# Patient Record
Sex: Male | Born: 1935 | Race: White | Hispanic: No | Marital: Married | Smoking: Never smoker
Health system: Southern US, Community
[De-identification: ages and names within clinical notes are randomized; demographics above are authoritative.]

## PROBLEM LIST (undated history)

## (undated) DIAGNOSIS — Z8719 Personal history of other diseases of the digestive system: Secondary | ICD-10-CM

## (undated) DIAGNOSIS — D72820 Lymphocytosis (symptomatic): Secondary | ICD-10-CM

## (undated) DIAGNOSIS — Z8673 Personal history of transient ischemic attack (TIA), and cerebral infarction without residual deficits: Secondary | ICD-10-CM

## (undated) DIAGNOSIS — I2699 Other pulmonary embolism without acute cor pulmonale: Secondary | ICD-10-CM

## (undated) DIAGNOSIS — E785 Hyperlipidemia, unspecified: Secondary | ICD-10-CM

## (undated) DIAGNOSIS — O2230 Deep phlebothrombosis in pregnancy, unspecified trimester: Secondary | ICD-10-CM

## (undated) DIAGNOSIS — C9190 Lymphoid leukemia, unspecified not having achieved remission: Secondary | ICD-10-CM

## (undated) DIAGNOSIS — R42 Dizziness and giddiness: Secondary | ICD-10-CM

## (undated) DIAGNOSIS — I10 Essential (primary) hypertension: Secondary | ICD-10-CM

## (undated) HISTORY — DX: Essential (primary) hypertension: I10

## (undated) HISTORY — DX: Personal history of transient ischemic attack (TIA), and cerebral infarction without residual deficits: Z86.73

## (undated) HISTORY — DX: Deep phlebothrombosis in pregnancy, unspecified trimester: O22.30

## (undated) HISTORY — DX: Lymphoid leukemia, unspecified not having achieved remission: C91.90

## (undated) HISTORY — DX: Other pulmonary embolism without acute cor pulmonale: I26.99

## (undated) HISTORY — PX: OTHER SURGICAL HISTORY: SHX169

## (undated) HISTORY — DX: Lymphocytosis (symptomatic): D72.820

## (undated) HISTORY — DX: Personal history of other diseases of the digestive system: Z87.19

## (undated) HISTORY — DX: Hyperlipidemia, unspecified: E78.5

## (undated) HISTORY — DX: Dizziness and giddiness: R42

---

## 2001-01-18 HISTORY — PX: OTHER SURGICAL HISTORY: SHX169

## 2001-01-31 ENCOUNTER — Encounter: Payer: Self-pay | Admitting: Emergency Medicine

## 2001-01-31 ENCOUNTER — Inpatient Hospital Stay (HOSPITAL_COMMUNITY): Admission: EM | Admit: 2001-01-31 | Discharge: 2001-02-01 | Payer: Self-pay | Admitting: Emergency Medicine

## 2001-02-01 ENCOUNTER — Encounter: Payer: Self-pay | Admitting: Family Medicine

## 2001-02-06 ENCOUNTER — Encounter: Admission: RE | Admit: 2001-02-06 | Discharge: 2001-02-06 | Payer: Self-pay | Admitting: Family Medicine

## 2001-02-13 ENCOUNTER — Encounter: Admission: RE | Admit: 2001-02-13 | Discharge: 2001-02-13 | Payer: Self-pay | Admitting: Family Medicine

## 2001-02-22 ENCOUNTER — Encounter: Admission: RE | Admit: 2001-02-22 | Discharge: 2001-02-22 | Payer: Self-pay | Admitting: Family Medicine

## 2001-05-25 ENCOUNTER — Encounter: Admission: RE | Admit: 2001-05-25 | Discharge: 2001-05-25 | Payer: Self-pay | Admitting: Family Medicine

## 2001-07-07 ENCOUNTER — Encounter: Admission: RE | Admit: 2001-07-07 | Discharge: 2001-07-07 | Payer: Self-pay | Admitting: Family Medicine

## 2001-11-21 ENCOUNTER — Encounter: Admission: RE | Admit: 2001-11-21 | Discharge: 2001-11-21 | Payer: Self-pay | Admitting: Family Medicine

## 2003-05-16 ENCOUNTER — Encounter: Admission: RE | Admit: 2003-05-16 | Discharge: 2003-05-16 | Payer: Self-pay | Admitting: Family Medicine

## 2003-06-13 ENCOUNTER — Encounter: Admission: RE | Admit: 2003-06-13 | Discharge: 2003-06-13 | Payer: Self-pay | Admitting: Family Medicine

## 2003-08-05 ENCOUNTER — Encounter: Admission: RE | Admit: 2003-08-05 | Discharge: 2003-08-05 | Payer: Self-pay | Admitting: Family Medicine

## 2003-10-01 ENCOUNTER — Encounter: Admission: RE | Admit: 2003-10-01 | Discharge: 2003-10-01 | Payer: Self-pay | Admitting: Family Medicine

## 2004-02-07 ENCOUNTER — Encounter: Admission: RE | Admit: 2004-02-07 | Discharge: 2004-02-07 | Payer: Self-pay | Admitting: Sports Medicine

## 2005-06-20 DIAGNOSIS — D72820 Lymphocytosis (symptomatic): Secondary | ICD-10-CM

## 2005-06-20 HISTORY — DX: Lymphocytosis (symptomatic): D72.820

## 2005-07-05 ENCOUNTER — Ambulatory Visit: Payer: Self-pay | Admitting: Family Medicine

## 2005-08-24 ENCOUNTER — Ambulatory Visit: Payer: Self-pay | Admitting: Oncology

## 2005-09-02 ENCOUNTER — Other Ambulatory Visit: Admission: RE | Admit: 2005-09-02 | Discharge: 2005-09-02 | Payer: Self-pay | Admitting: Oncology

## 2005-11-29 ENCOUNTER — Encounter: Admission: RE | Admit: 2005-11-29 | Discharge: 2005-12-28 | Payer: Self-pay | Admitting: Family Medicine

## 2005-11-30 ENCOUNTER — Ambulatory Visit: Payer: Self-pay | Admitting: Oncology

## 2006-03-08 ENCOUNTER — Ambulatory Visit: Payer: Self-pay | Admitting: Oncology

## 2006-03-09 LAB — CBC WITH DIFFERENTIAL (CANCER CENTER ONLY)
Eosinophils Absolute: 0.1 10*3/uL (ref 0.0–0.5)
HCT: 37.9 % — ABNORMAL LOW (ref 38.7–49.9)
HGB: 12.8 g/dL — ABNORMAL LOW (ref 13.0–17.1)
LYMPH%: 74.6 % — ABNORMAL HIGH (ref 14.0–48.0)
MCV: 103 fL — ABNORMAL HIGH (ref 82–98)
MONO#: 0.5 10*3/uL (ref 0.1–0.9)
NEUT%: 19.4 % — ABNORMAL LOW (ref 40.0–80.0)
RBC: 3.66 10*6/uL — ABNORMAL LOW (ref 4.20–5.70)
WBC: 15.8 10*3/uL — ABNORMAL HIGH (ref 4.0–10.0)

## 2006-03-10 LAB — IGG, IGA, IGM
IgG (Immunoglobin G), Serum: 697 mg/dL (ref 694–1618)
IgM, Serum: 28 mg/dL — ABNORMAL LOW (ref 60–263)

## 2006-03-10 LAB — LACTATE DEHYDROGENASE: LDH: 131 U/L (ref 94–250)

## 2006-03-10 LAB — COMPREHENSIVE METABOLIC PANEL
AST: 17 U/L (ref 0–37)
Albumin: 4.1 g/dL (ref 3.5–5.2)
Alkaline Phosphatase: 55 U/L (ref 39–117)
BUN: 21 mg/dL (ref 6–23)
Glucose, Bld: 93 mg/dL (ref 70–99)
Potassium: 4 mEq/L (ref 3.5–5.3)
Total Bilirubin: 1.2 mg/dL (ref 0.3–1.2)

## 2006-03-10 LAB — DIRECT ANTIGLOBULIN TEST (NOT AT ARMC): DAT IgG: NEGATIVE

## 2006-08-26 ENCOUNTER — Ambulatory Visit: Payer: Self-pay | Admitting: Oncology

## 2006-08-29 LAB — CBC WITH DIFFERENTIAL (CANCER CENTER ONLY)
Eosinophils Absolute: 0.2 10*3/uL (ref 0.0–0.5)
MCV: 102 fL — ABNORMAL HIGH (ref 82–98)
MONO#: 1.4 10*3/uL — ABNORMAL HIGH (ref 0.1–0.9)
NEUT#: 4.2 10*3/uL (ref 1.5–6.5)
Platelets: 194 10*3/uL (ref 145–400)
RBC: 4.58 10*6/uL (ref 4.20–5.70)
WBC: 26.9 10*3/uL — ABNORMAL HIGH (ref 4.0–10.0)

## 2006-08-30 LAB — COMPREHENSIVE METABOLIC PANEL
ALT: 19 U/L (ref 0–53)
Alkaline Phosphatase: 71 U/L (ref 39–117)
Creatinine, Ser: 1.3 mg/dL (ref 0.40–1.50)
Sodium: 142 mEq/L (ref 135–145)
Total Bilirubin: 1.8 mg/dL — ABNORMAL HIGH (ref 0.3–1.2)
Total Protein: 6.2 g/dL (ref 6.0–8.3)

## 2006-08-30 LAB — HAPTOGLOBIN: Haptoglobin: 79 mg/dL (ref 16–200)

## 2006-08-30 LAB — DIRECT ANTIGLOBULIN TEST (NOT AT ARMC): DAT IgG: NEGATIVE

## 2006-08-30 LAB — LACTATE DEHYDROGENASE: LDH: 132 U/L (ref 94–250)

## 2006-08-30 LAB — IGG, IGA, IGM
IgG (Immunoglobin G), Serum: 853 mg/dL (ref 694–1618)
IgM, Serum: 35 mg/dL — ABNORMAL LOW (ref 60–263)

## 2006-10-05 ENCOUNTER — Ambulatory Visit: Payer: Self-pay | Admitting: Oncology

## 2006-11-17 DIAGNOSIS — F528 Other sexual dysfunction not due to a substance or known physiological condition: Secondary | ICD-10-CM

## 2006-12-26 ENCOUNTER — Ambulatory Visit: Payer: Self-pay | Admitting: Oncology

## 2006-12-27 LAB — CBC WITH DIFFERENTIAL (CANCER CENTER ONLY)
BASO#: 0.6 10*3/uL — ABNORMAL HIGH (ref 0.0–0.2)
Eosinophils Absolute: 0.3 10*3/uL (ref 0.0–0.5)
HGB: 15.8 g/dL (ref 13.0–17.1)
LYMPH%: 76.1 % — ABNORMAL HIGH (ref 14.0–48.0)
MCH: 35.1 pg — ABNORMAL HIGH (ref 28.0–33.4)
MCV: 103 fL — ABNORMAL HIGH (ref 82–98)
MONO#: 1 10*3/uL — ABNORMAL HIGH (ref 0.1–0.9)
NEUT#: 3.9 10*3/uL (ref 1.5–6.5)
Platelets: 213 10*3/uL (ref 145–400)
RBC: 4.51 10*6/uL (ref 4.20–5.70)
WBC: 24.5 10*3/uL — ABNORMAL HIGH (ref 4.0–10.0)

## 2006-12-27 LAB — COMPREHENSIVE METABOLIC PANEL
Albumin: 4.3 g/dL (ref 3.5–5.2)
BUN: 17 mg/dL (ref 6–23)
CO2: 27 mEq/L (ref 19–32)
Glucose, Bld: 109 mg/dL — ABNORMAL HIGH (ref 70–99)
Potassium: 4 mEq/L (ref 3.5–5.3)
Sodium: 139 mEq/L (ref 135–145)
Total Bilirubin: 1.4 mg/dL — ABNORMAL HIGH (ref 0.3–1.2)
Total Protein: 6.9 g/dL (ref 6.0–8.3)

## 2006-12-27 LAB — IGG, IGA, IGM: IgG (Immunoglobin G), Serum: 883 mg/dL (ref 694–1618)

## 2006-12-27 LAB — LACTATE DEHYDROGENASE: LDH: 160 U/L (ref 94–250)

## 2006-12-27 LAB — BETA 2 MICROGLOBULIN, SERUM: Beta-2 Microglobulin: 2.68 mg/L — ABNORMAL HIGH (ref 1.01–1.73)

## 2007-01-03 LAB — FOLATE: Folate: >20 ng/mL

## 2007-06-01 ENCOUNTER — Ambulatory Visit: Payer: Self-pay

## 2007-06-08 ENCOUNTER — Ambulatory Visit: Payer: Self-pay

## 2007-06-26 ENCOUNTER — Ambulatory Visit: Payer: Self-pay | Admitting: Oncology

## 2007-06-27 LAB — HAPTOGLOBIN: Haptoglobin: 123 mg/dL (ref 16–200)

## 2007-06-27 LAB — COMPREHENSIVE METABOLIC PANEL
Albumin: 4.2 g/dL (ref 3.5–5.2)
BUN: 19 mg/dL (ref 6–23)
CO2: 27 mEq/L (ref 19–32)
Glucose, Bld: 98 mg/dL (ref 70–99)
Potassium: 3.9 mEq/L (ref 3.5–5.3)
Sodium: 143 mEq/L (ref 135–145)
Total Protein: 6.5 g/dL (ref 6.0–8.3)

## 2007-06-27 LAB — CBC WITH DIFFERENTIAL (CANCER CENTER ONLY)
Eosinophils Absolute: 0.1 10*3/uL (ref 0.0–0.5)
HCT: 38.6 % — ABNORMAL LOW (ref 38.7–49.9)
LYMPH%: 74.8 % — ABNORMAL HIGH (ref 14.0–48.0)
MCH: 34.6 pg — ABNORMAL HIGH (ref 28.0–33.4)
MCV: 100 fL — ABNORMAL HIGH (ref 82–98)
MONO#: 1 10*3/uL — ABNORMAL HIGH (ref 0.1–0.9)
MONO%: 4.2 % (ref 0.0–13.0)
NEUT%: 18.4 % — ABNORMAL LOW (ref 40.0–80.0)
RBC: 3.85 10*6/uL — ABNORMAL LOW (ref 4.20–5.70)
WBC: 22.9 10*3/uL — ABNORMAL HIGH (ref 4.0–10.0)

## 2007-06-27 LAB — LACTATE DEHYDROGENASE: LDH: 121 U/L (ref 94–250)

## 2007-11-18 ENCOUNTER — Emergency Department: Payer: Self-pay | Admitting: Internal Medicine

## 2007-11-18 ENCOUNTER — Inpatient Hospital Stay (HOSPITAL_COMMUNITY): Admission: AD | Admit: 2007-11-18 | Discharge: 2007-11-24 | Payer: Self-pay | Admitting: Cardiology

## 2007-11-18 ENCOUNTER — Ambulatory Visit: Payer: Self-pay | Admitting: Cardiology

## 2007-11-20 ENCOUNTER — Encounter (INDEPENDENT_AMBULATORY_CARE_PROVIDER_SITE_OTHER): Payer: Self-pay | Admitting: Neurology

## 2007-11-27 ENCOUNTER — Ambulatory Visit: Payer: Self-pay | Admitting: Gastroenterology

## 2007-11-29 ENCOUNTER — Inpatient Hospital Stay (HOSPITAL_COMMUNITY): Admission: EM | Admit: 2007-11-29 | Discharge: 2007-12-02 | Payer: Self-pay | Admitting: Emergency Medicine

## 2007-12-21 ENCOUNTER — Emergency Department: Payer: Self-pay | Admitting: Emergency Medicine

## 2007-12-21 ENCOUNTER — Ambulatory Visit: Payer: Self-pay | Admitting: Family Medicine

## 2007-12-21 ENCOUNTER — Inpatient Hospital Stay (HOSPITAL_COMMUNITY): Admission: AD | Admit: 2007-12-21 | Discharge: 2007-12-26 | Payer: Self-pay | Admitting: *Deleted

## 2007-12-22 ENCOUNTER — Ambulatory Visit: Payer: Self-pay | Admitting: Vascular Surgery

## 2007-12-22 ENCOUNTER — Encounter (INDEPENDENT_AMBULATORY_CARE_PROVIDER_SITE_OTHER): Payer: Self-pay | Admitting: Internal Medicine

## 2007-12-28 ENCOUNTER — Ambulatory Visit: Payer: Self-pay | Admitting: Internal Medicine

## 2008-01-11 ENCOUNTER — Ambulatory Visit: Payer: Self-pay | Admitting: Oncology

## 2008-01-15 ENCOUNTER — Ambulatory Visit: Payer: Self-pay | Admitting: Gastroenterology

## 2008-01-15 LAB — IRON AND TIBC
Iron: 29 ug/dL — ABNORMAL LOW (ref 42–165)
TIBC: 222 ug/dL (ref 215–435)
UIBC: 193 ug/dL

## 2008-01-15 LAB — CBC WITH DIFFERENTIAL (CANCER CENTER ONLY)
Eosinophils Absolute: 0.2 10*3/uL (ref 0.0–0.5)
MCH: 28.1 pg (ref 28.0–33.4)
MONO%: 4 % (ref 0.0–13.0)
NEUT#: 4.7 10*3/uL (ref 1.5–6.5)
Platelets: 199 10*3/uL (ref 145–400)
RBC: 4.35 10*6/uL (ref 4.20–5.70)
WBC: 19.5 10*3/uL — ABNORMAL HIGH (ref 4.0–10.0)

## 2008-01-15 LAB — FERRITIN: Ferritin: 39 ng/mL (ref 22–322)

## 2008-05-26 ENCOUNTER — Emergency Department (HOSPITAL_COMMUNITY): Admission: EM | Admit: 2008-05-26 | Discharge: 2008-05-26 | Payer: Self-pay | Admitting: Emergency Medicine

## 2008-06-24 ENCOUNTER — Ambulatory Visit: Payer: Self-pay | Admitting: Neurology

## 2008-07-09 ENCOUNTER — Ambulatory Visit: Payer: Self-pay | Admitting: Oncology

## 2008-07-15 LAB — CBC WITH DIFFERENTIAL (CANCER CENTER ONLY)
BASO#: 0.6 10*3/uL — ABNORMAL HIGH (ref 0.0–0.2)
BASO%: 2.8 % — ABNORMAL HIGH (ref 0.0–2.0)
EOS%: 0.4 % (ref 0.0–7.0)
MCH: 35.7 pg — ABNORMAL HIGH (ref 28.0–33.4)
MCHC: 35 g/dL (ref 32.0–35.9)
MONO%: 4.2 % (ref 0.0–13.0)
NEUT#: 3.6 10*3/uL (ref 1.5–6.5)
Platelets: 173 10*3/uL (ref 145–400)
RDW: 11.9 % (ref 10.5–14.6)

## 2008-07-15 LAB — CMP (CANCER CENTER ONLY)
AST: 35 U/L (ref 11–38)
Albumin: 4.1 g/dL (ref 3.3–5.5)
Alkaline Phosphatase: 65 U/L (ref 26–84)
Potassium: 4 mEq/L (ref 3.3–4.7)
Sodium: 145 mEq/L (ref 128–145)
Total Protein: 6.6 g/dL (ref 6.4–8.1)

## 2008-07-16 LAB — DIRECT ANTIGLOBULIN TEST (NOT AT ARMC): DAT IgG: NEGATIVE

## 2008-12-30 ENCOUNTER — Ambulatory Visit: Payer: Self-pay | Admitting: Oncology

## 2008-12-31 LAB — CBC WITH DIFFERENTIAL (CANCER CENTER ONLY)
HCT: 43 % (ref 38.7–49.9)
MCH: 35.4 pg — ABNORMAL HIGH (ref 28.0–33.4)
MCHC: 34.6 g/dL (ref 32.0–35.9)
MCV: 102 fL — ABNORMAL HIGH (ref 82–98)
RDW: 11.7 % (ref 10.5–14.6)

## 2008-12-31 LAB — MANUAL DIFFERENTIAL (CHCC SATELLITE)
LYMPH: 51 % — ABNORMAL HIGH (ref 14–48)
Platelet Morphology: NORMAL
RBC Comments: NORMAL
SEG: 23 % — ABNORMAL LOW (ref 40–75)
Variant Lymph: 23 % — ABNORMAL HIGH (ref 0–0)

## 2008-12-31 LAB — CMP (CANCER CENTER ONLY)
ALT(SGPT): 23 U/L (ref 10–47)
AST: 35 U/L (ref 11–38)
Albumin: 3.7 g/dL (ref 3.3–5.5)
Alkaline Phosphatase: 71 U/L (ref 26–84)
BUN, Bld: 15 mg/dL (ref 7–22)
Potassium: 3.5 mEq/L (ref 3.3–4.7)
Sodium: 147 mEq/L — ABNORMAL HIGH (ref 128–145)

## 2009-01-01 LAB — IGG, IGA, IGM
IgA: 68 mg/dL (ref 68–378)
IgG (Immunoglobin G), Serum: 714 mg/dL (ref 694–1618)

## 2010-01-07 ENCOUNTER — Ambulatory Visit: Payer: Self-pay | Admitting: Oncology

## 2010-02-25 ENCOUNTER — Ambulatory Visit: Payer: Self-pay | Admitting: Cardiology

## 2010-02-25 ENCOUNTER — Inpatient Hospital Stay (HOSPITAL_COMMUNITY): Admission: EM | Admit: 2010-02-25 | Discharge: 2010-02-26 | Payer: Self-pay | Admitting: Emergency Medicine

## 2010-02-26 ENCOUNTER — Encounter: Payer: Self-pay | Admitting: Cardiology

## 2010-03-17 DIAGNOSIS — K279 Peptic ulcer, site unspecified, unspecified as acute or chronic, without hemorrhage or perforation: Secondary | ICD-10-CM | POA: Insufficient documentation

## 2010-03-17 DIAGNOSIS — Z8679 Personal history of other diseases of the circulatory system: Secondary | ICD-10-CM | POA: Insufficient documentation

## 2010-03-17 DIAGNOSIS — R0789 Other chest pain: Secondary | ICD-10-CM

## 2010-03-17 DIAGNOSIS — K922 Gastrointestinal hemorrhage, unspecified: Secondary | ICD-10-CM | POA: Insufficient documentation

## 2010-03-17 DIAGNOSIS — Z87898 Personal history of other specified conditions: Secondary | ICD-10-CM

## 2010-03-17 DIAGNOSIS — E785 Hyperlipidemia, unspecified: Secondary | ICD-10-CM

## 2010-03-17 DIAGNOSIS — I10 Essential (primary) hypertension: Secondary | ICD-10-CM | POA: Insufficient documentation

## 2010-03-17 DIAGNOSIS — Z86718 Personal history of other venous thrombosis and embolism: Secondary | ICD-10-CM

## 2010-03-17 DIAGNOSIS — R42 Dizziness and giddiness: Secondary | ICD-10-CM

## 2010-03-17 DIAGNOSIS — C9110 Chronic lymphocytic leukemia of B-cell type not having achieved remission: Secondary | ICD-10-CM

## 2010-03-17 DIAGNOSIS — I82409 Acute embolism and thrombosis of unspecified deep veins of unspecified lower extremity: Secondary | ICD-10-CM | POA: Insufficient documentation

## 2010-04-01 ENCOUNTER — Ambulatory Visit: Payer: Self-pay | Admitting: Cardiology

## 2010-04-06 ENCOUNTER — Ambulatory Visit (HOSPITAL_COMMUNITY): Admission: RE | Admit: 2010-04-06 | Discharge: 2010-04-06 | Payer: Self-pay | Admitting: Urology

## 2010-10-11 ENCOUNTER — Encounter: Payer: Self-pay | Admitting: Oncology

## 2010-10-20 NOTE — Consult Note (Signed)
Summary: [facility]   Imported By: Roderic Ovens 03/13/2010 16:15:39  _____________________________________________________________________  External Attachment:    Type:   Image     Comment:   External Document

## 2010-10-20 NOTE — Assessment & Plan Note (Signed)
Summary: eph/jss   Visit Type:  EPH Primary Provider:  Jerl Mina  CC:  pt is going to have lithrotripsy on Monday 18th Dr. Vernie Ammons.....Marland Kitchensome chest tightness within the last few weeks states pt....no other complaintrs today.  History of Present Illness: [name] returns today post hospitalization for atypical chest pain, Since discharge, he took a SL NTG for an episode of pain and nearly passed out. He is a difficult historian but the discomfort does not sound cardiac. His daughter verifies the story.  Current Medications (verified): 1)  Meclizine Hcl 25 Mg Tabs (Meclizine Hcl) .Marland Kitchen.. 1 Tab Two Times A Day 2)  Nitrostat 0.4 Mg Subl (Nitroglycerin) .Marland Kitchen.. 1 Tablet Under Tongue At Onset of Chest Pain; You May Repeat Every 5 Minutes For Up To 3 Doses. 3)  Flomax 0.4 Mg Caps (Tamsulosin Hcl) .Marland Kitchen.. 1 Cap Once Daily 4)  Aspirin 81 Mg Tbec (Aspirin) .... Take One Tablet By Mouth Daily 5)  Ferrous Sulfate 325 (65 Fe) Mg Tabs (Ferrous Sulfate) .Marland Kitchen.. 1 Tab Once Daily 6)  Hydrochlorothiazide 25 Mg Tabs (Hydrochlorothiazide) .Marland Kitchen.. 1 Tab Once Daily 7)  Multivitamins   Tabs (Multiple Vitamin) .Marland Kitchen.. 1 Tab Once Daily 8)  Vitamin B-6 100 Mg Tabs (Pyridoxine Hcl) .Marland Kitchen.. 1 Tab Once Daily 9)  Simvastatin 20 Mg Tabs (Simvastatin) .Marland Kitchen.. 1 Tab At Bedtime 10)  Pantoprazole Sodium 40 Mg Tbec (Pantoprazole Sodium) .Marland Kitchen.. 1 Tab Once Daily 11)  Aricept 10 Mg Tabs (Donepezil Hcl) .Marland Kitchen.. 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  EF wnl. - 01/18/2001, Lens implants, bil - 07/08/2005, MRI with MRA - L frontal CVA - 01/18/2001  1. Appendectomy.   2. Right inguinal hernia repair.      Family History: Last  updated: 03/17/2010 children - healthy  Noncontributory secondary to the patient's advanced age   but no premature diagnosis of coronary artery disease.   Social History: Last updated: 03/17/2010 Lives with wife in [place].  No tob, etoh.  Retired. 3 children, 5 grandchildren. Regular Exercise - no Drug Use - no  Risk Factors: Exercise: no (03/17/2010)  Review of Systems       negative other than history of present illness  Vital Signs:  Patient profile:   77 year old male Height:      74 inches Weight:      219 pounds BMI:     28.22 Pulse rate:   64 / minute Pulse rhythm:   regular BP sitting:   122 / 64  (left arm) Cuff size:   large  Vitals Entered By: Danielle Rankin, CMA (April 01, 2010 3:23 PM)  Physical Exam  General:  elderly, in no acute distress Head:  normocephalic and atraumatic Eyes:  PERRLA/EOM intact; conjunctiva and lids normal. Neck:  Neck supple, no JVD. No masses, thyromegaly or abnormal cervical nodes. Lungs:  Clear bilaterally to auscultation and percussion. Heart:  Non-displaced PMI, chest non-tender; regular rate and rhythm, S1, S2 without murmurs, rubs or gallops. Carotid upstroke normal, no bruit. Normal abdominal aortic size, no bruits. Femorals normal pulses, no bruits. Pedals normal pulses. No edema, lots of varicose veins, no sign of DVT Msk:  decreased ROM.   Pulses:  pulses normal in all 4 extremities  Extremities:  1+ left pedal edema and 1+ right pedal edema.   Neurologic:  Alert and oriented x 3. pleasantly demented Skin:  Intact without lesions or rashes. Psych:  Normal affect.   Problems:  Medical Problems Added: 1)  Dx of Dizziness  (ICD-780.4)  Impression & Recommendations:  Problem # 1:  DIZZINESS (ICD-780.4)  This seems to be responding well to meclizine. I renewed this today.  Problem # 2:  PULMONARY EMBOLISM, HX OF (ICD-V12.51)  The following medications were removed from the medication list:    Coumadin 7.5 Mg Tabs  (Warfarin sodium) .Marland Kitchen... Take as directed His updated medication list for this problem includes:    Aspirin 81 Mg Tbec (Aspirin) .Marland Kitchen... Take one tablet by mouth daily  Problem # 3:  CHEST PAIN, ATYPICAL (ICD-786.59) His chest discomfort has been atypical all along. I have recommended not taking nitroglycerin anymore since it's not helping and he became presyncopal just sitting in a chair. I discussed this with his daughter as well. The following medications were removed from the medication list:    Nitrostat 0.4 Mg Subl (Nitroglycerin) .Marland Kitchen... 1 tablet under tongue at onset of chest pain; you may repeat every 5 minutes for up to 3 doses.    Coumadin 7.5 Mg Tabs (Warfarin sodium) .Marland Kitchen... Take as directed His updated medication list for this problem includes:    Aspirin 81 Mg Tbec (Aspirin) .Marland Kitchen... Take one tablet by mouth daily  Problem # 4:  HYPERTENSION (ICD-401.9) Assessment: Unchanged  His updated medication list for this problem includes:    Aspirin 81 Mg Tbec (Aspirin) .Marland Kitchen... Take one tablet by mouth daily    Hydrochlorothiazide 25 Mg Tabs (Hydrochlorothiazide) .Marland Kitchen... 1 tab once daily  Problem # 5:  COUMADIN THERAPY (ICD-V58.61) He will restart this after his lithotripsy. Daughter aware  Patient Instructions: 1)  Your physician recommends that you schedule a follow-up appointment in: as needed with Dr. Daleen Squibb 2)  Your physician recommends that you continue on your current medications as directed. Please refer to the Current Medication list given to you today. Prescriptions: MECLIZINE HCL 25 MG TABS (MECLIZINE HCL) 1 tab two times a day  #90 x 3   Entered by:   Lisabeth Devoid RN   Authorized by:   Gaylord Shih, MD, [facility]   Signed by:   Lisabeth Devoid RN on 04/01/2010   Method used:   Electronically to        [facility]* (retail)             ,          Ph: [phone]       Fax: [phone]   RxID:   [id number]

## 2010-10-21 ENCOUNTER — Encounter: Payer: Self-pay | Admitting: Cardiovascular Disease

## 2010-11-03 ENCOUNTER — Encounter: Payer: Self-pay | Admitting: Cardiovascular Disease

## 2010-11-03 ENCOUNTER — Ambulatory Visit (INDEPENDENT_AMBULATORY_CARE_PROVIDER_SITE_OTHER): Payer: Medicare Other | Admitting: Cardiovascular Disease

## 2010-11-03 DIAGNOSIS — G459 Transient cerebral ischemic attack, unspecified: Secondary | ICD-10-CM

## 2010-11-03 DIAGNOSIS — I10 Essential (primary) hypertension: Secondary | ICD-10-CM

## 2010-11-03 DIAGNOSIS — E785 Hyperlipidemia, unspecified: Secondary | ICD-10-CM

## 2010-11-03 DIAGNOSIS — R42 Dizziness and giddiness: Secondary | ICD-10-CM

## 2010-11-11 NOTE — Assessment & Plan Note (Signed)
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 Occasional dizziness. Went to [employer] last year. Marland Kitchen  History of Present Illness: [name] is an 77 yo male with a h/o Atypical chest pain, bph, small bladder stones, chronic vertigo,  chronic lymphocytic leukemia, deep venous thrombosis and pulmonary embolus, now on chronic Coumadin therapy, History of prior gastrointestinal bleeding secondary to jejunal lesions,  transient ischemic attack, History of migraines who presents for routine follow up.   He is a difficult historian though  His daughter knows his full history. She states that he continues to have chronic dizziness. She is very concerned with medication side effects. Meclizine is not helping his dizziness. Dizziness seems to occur when he is standing, not sitting or lying down. She does report some low blood pressures at home.   he has rare episodes of chest pain. Details are uncertain. His daughter believes it is muscle as it is reproducible with palpation.  He denies any chest pain, significant shortness of breath. He does have some muscle pain in his legs and his daughter is concerned about side effects from the statin.  EKG shows normal sinus rhythm with rate 66 beats per minute, right bundle branch block  echocardiogram in 2001 showing normal systolic function, mild aortic valve regurgitation, mild MR  Current Medications (verified): 1)  Meclizine Hcl 25 Mg Tabs (Meclizine Hcl) .Marland Kitchen.. 1 Tablet Once Daily 2)  Flomax 0.4 Mg Caps (Tamsulosin Hcl) .Marland Kitchen.. 1 Cap Once Daily 3)  Aspirin 81 Mg Tbec (Aspirin) .... Take One Tablet By Mouth Daily 4)  Hydrochlorothiazide 25 Mg Tabs (Hydrochlorothiazide) .Marland Kitchen.. 1 Tab Once Daily 5)  Multivitamins   Tabs (Multiple Vitamin) .Marland Kitchen.. 1 Tab Once Daily 6)  Simvastatin 20 Mg Tabs (Simvastatin) .Marland Kitchen.. 1 Tab At Bedtime 7)  Pantoprazole Sodium 40 Mg Tbec (Pantoprazole Sodium)  .Marland Kitchen.. 1 Tab Once Daily 8)  Aricept 10 Mg Tabs (Donepezil Hcl) .Marland Kitchen.. 1 Tab Once Daily 9)  Warfarin Sodium 2 Mg Tabs (Warfarin Sodium) .... 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  EF wnl. - 01/18/2001, Lens implants, bil - 07/08/2005, MRI with MRA - L frontal CVA - 01/18/2001  1. Appendectomy.   2. Right inguinal hernia repair.      Family History: Last updated: 03/17/2010 children - healthy  Noncontributory secondary to the patient's advanced age   but no premature diagnosis of coronary artery disease.   Social History: Last updated: 03/17/2010 Lives with wife in [place].  No tob, etoh.  Retired. 3 children, 5 grandchildren. Regular Exercise - no Drug Use - no  Risk Factors: Exercise: no (03/17/2010)  Review of Systems       The patient complains of difficulty walking.  The patient denies fever, weight loss, weight gain, vision loss, decreased hearing, hoarseness, chest pain, syncope, dyspnea on exertion, peripheral edema, prolonged cough, abdominal pain, incontinence, muscle weakness, depression, and enlarged lymph nodes.         dizzy  Vital Signs:  Patient profile:   77 year old male Height:      74 inches Weight:  217 pounds BMI:     27.96 Pulse rate:   66 / minute BP sitting:   145 / 75  (left arm) Cuff size:   regular  Vitals Entered By: Lysbeth Galas CMA (November 03, 2010 11:47 AM)  Physical Exam  General:  elderly, in no acute distress Head:  normocephalic and atraumatic Neck:  Neck supple, no JVD. No masses, thyromegaly or abnormal cervical nodes. Lungs:  Clear bilaterally to auscultation and  percussion. Heart:  Non-displaced PMI, chest non-tender; regular rate and rhythm, S1, S2 without murmurs, rubs or gallops. Carotid upstroke normal, no bruit. Normal abdominal aortic size, no bruits. Femorals normal pulses, no bruits. Pedals normal pulses. Trace LE edema b/l, lots of varicose veins, no sign of DVT Abdomen:  Bowel sounds positive; abdomen soft and non-tender without masses Msk:  decreased ROM.   Pulses:  pulses normal in all 4 extremities Extremities:  1+ left pedal edema and 1+ right pedal edema.   Neurologic:  Alert and oriented x 3. pleasantly demented Skin:  Intact without lesions or rashes. Psych:  Normal affect.   Impression & Recommendations:  Problem # 1:  DIZZINESS (ICD-780.4) etiology of his dizziness is uncertain. Notes indicate chronic vertigo. His daughter reports he has had this for at least 3 years. She is concerned about side effects of medications. She does not think that HCTZ has been changed in an effort to make sure this is not causing his symptoms. We have suggested that he could hold his HCTZ for one week with close monitoring of his blood pressure and symptoms. If his dizziness improves, we could change his blood pressure to an alternate medication if needed depending on their measurements. If he continues to have dizziness, we would restart the medication.  Problem # 2:  DYSLIPIDEMIA (ICD-272.4) Enouraged to stay on his statin. His daughter is concerned about The muscle aches in his legs being secondary to the statin. I have suggested we work on his dizziness first before holding his simvastatin. Holding the statin for a trial period is something that we could try at a later date after we have excluded HCTZ as the cause of his dizziness.  His updated medication list for this problem includes:    Simvastatin 20 Mg Tabs (Simvastatin) .Marland Kitchen... 1 tab at bedtime  Problem # 3:  HYPERTENSION (ICD-401.9) I have suggested he watch his blood pressure after  holding HCTZ and contact our office with his measurements for further medication adjustment.  His updated medication list for this problem includes:    Aspirin 81 Mg Tbec (Aspirin) .Marland Kitchen... Take one tablet by mouth daily    Hydrochlorothiazide 25 Mg Tabs (Hydrochlorothiazide) .Marland Kitchen... 1 tab once daily  Problem # 4:  COUMADIN THERAPY (ICD-V58.61) He is on warfarin for a history of DVT, PE, TIA. No recent falls.  Problem # 5:  TRANSIENT ISCHEMIC ATTACKS, HX OF (ICD-V12.50) history of TIA x2. We will try to obtain his most recent carotid ultrasound.  Other Orders: EKG w/ Interpretation (93000)  Patient Instructions: 1)  Your physician recommends that you follow up as needed. 2)  Your physician has recommended you make the following change in your medication: HOLD HCTZ FOR 1 WEEK. IF DIZZINESS HAS NOT RESOLVED AFTER 1 WEEK OF HOLDING, RESTART HCTZ.

## 2010-12-01 NOTE — Letter (Signed)
Summary: [facility]  [facility]   Imported By: Roderic Ovens 11/26/2010 16:17:14  _____________________________________________________________________  External Attachment:    Type:   Image     Comment:   External Document

## 2010-12-05 LAB — APTT: aPTT: 27 seconds (ref 24–37)

## 2010-12-05 LAB — PROTIME-INR
INR: 1.14 (ref 0.00–1.49)
Prothrombin Time: 14.5 seconds (ref 11.6–15.2)

## 2010-12-06 LAB — PROTIME-INR
INR: 2.49 — ABNORMAL HIGH (ref 0.00–1.49)
Prothrombin Time: 26.7 seconds — ABNORMAL HIGH (ref 11.6–15.2)

## 2010-12-06 LAB — CBC
HCT: 43.6 % (ref 39.0–52.0)
Hemoglobin: 15.3 g/dL (ref 13.0–17.0)
MCH: 35.6 pg — ABNORMAL HIGH (ref 26.0–34.0)
MCHC: 35 g/dL (ref 30.0–36.0)
MCV: 101.6 fL — ABNORMAL HIGH (ref 78.0–100.0)
Platelets: 155 10*3/uL (ref 150–400)
RBC: 4.29 MIL/uL (ref 4.22–5.81)
RDW: 13.2 % (ref 11.5–15.5)
WBC: 23.2 10*3/uL — ABNORMAL HIGH (ref 4.0–10.5)

## 2010-12-06 LAB — BASIC METABOLIC PANEL
BUN: 16 mg/dL (ref 6–23)
CO2: 30 mEq/L (ref 19–32)
Calcium: 9.2 mg/dL (ref 8.4–10.5)
Chloride: 107 mEq/L (ref 96–112)
Creatinine, Ser: 1.23 mg/dL (ref 0.4–1.5)
GFR calc Af Amer: >60 mL/min (ref >60)
GFR calc non Af Amer: 57 mL/min — ABNORMAL LOW (ref >60)
Glucose, Bld: 113 mg/dL — ABNORMAL HIGH (ref 70–99)
Potassium: 3.8 mEq/L (ref 3.5–5.1)
Sodium: 143 mEq/L (ref 135–145)

## 2010-12-06 LAB — SURGICAL PCR SCREEN
MRSA, PCR: NEGATIVE
Staphylococcus aureus: POSITIVE — AB

## 2010-12-06 LAB — APTT: aPTT: 35 seconds (ref 24–37)

## 2010-12-07 LAB — CARDIAC PANEL(CRET KIN+CKTOT+MB+TROPI)
CK, MB: 0.8 ng/mL (ref 0.3–4.0)
CK, MB: 0.8 ng/mL (ref 0.3–4.0)
Relative Index: INVALID (ref 0.0–2.5)
Relative Index: INVALID (ref 0.0–2.5)
Total CK: 22 U/L (ref 7–232)
Total CK: 46 U/L (ref 7–232)
Troponin I: 0.01 ng/mL (ref 0.00–0.06)

## 2010-12-07 LAB — BASIC METABOLIC PANEL
Chloride: 105 mEq/L (ref 96–112)
GFR calc non Af Amer: 57 mL/min — ABNORMAL LOW (ref >60)
Potassium: 3.6 mEq/L (ref 3.5–5.1)
Sodium: 140 mEq/L (ref 135–145)

## 2010-12-07 LAB — URINALYSIS, ROUTINE W REFLEX MICROSCOPIC
Bilirubin Urine: NEGATIVE
Glucose, UA: NEGATIVE mg/dL
Ketones, ur: NEGATIVE mg/dL
Nitrite: NEGATIVE
Protein, ur: NEGATIVE mg/dL
pH: 5.5 (ref 5.0–8.0)

## 2010-12-07 LAB — HEPATIC FUNCTION PANEL
AST: 39 U/L — ABNORMAL HIGH (ref 0–37)
Albumin: 3.9 g/dL (ref 3.5–5.2)
Bilirubin, Direct: 0.4 mg/dL — ABNORMAL HIGH (ref 0.0–0.3)
Total Bilirubin: 2.3 mg/dL — ABNORMAL HIGH (ref 0.3–1.2)

## 2010-12-07 LAB — CBC
HCT: 44.6 % (ref 39.0–52.0)
MCHC: 34.7 g/dL (ref 30.0–36.0)
MCV: 103.8 fL — ABNORMAL HIGH (ref 78.0–100.0)
Platelets: 159 10*3/uL (ref 150–400)
Platelets: 177 10*3/uL (ref 150–400)
RBC: 4.3 MIL/uL (ref 4.22–5.81)
RDW: 13 % (ref 11.5–15.5)
RDW: 13.1 % (ref 11.5–15.5)
WBC: 25.3 10*3/uL — ABNORMAL HIGH (ref 4.0–10.5)
WBC: 26.4 10*3/uL — ABNORMAL HIGH (ref 4.0–10.5)

## 2010-12-07 LAB — DIFFERENTIAL
Basophils Relative: 0 % (ref 0–1)
Eosinophils Relative: 0 % (ref 0–5)
Neutro Abs: 5 10*3/uL (ref 1.7–7.7)
Neutrophils Relative %: 19 % — ABNORMAL LOW (ref 43–77)

## 2010-12-07 LAB — POCT CARDIAC MARKERS
CKMB, poc: <1 ng/mL — ABNORMAL LOW (ref 1.0–8.0)
Myoglobin, poc: 78.6 ng/mL (ref 12–200)

## 2010-12-07 LAB — LIPASE, BLOOD: Lipase: 25 U/L (ref 11–59)

## 2010-12-07 LAB — SAMPLE TO BLOOD BANK

## 2010-12-07 LAB — POCT I-STAT, CHEM 8
BUN: 17 mg/dL (ref 6–23)
Creatinine, Ser: 1.2 mg/dL (ref 0.4–1.5)
Hemoglobin: 15.3 g/dL (ref 13.0–17.0)
Potassium: 4.3 mEq/L (ref 3.5–5.1)
Sodium: 138 mEq/L (ref 135–145)
TCO2: 29 mmol/L (ref 0–100)

## 2010-12-07 LAB — URINE CULTURE

## 2010-12-07 LAB — TSH: TSH: 0.785 u[IU]/mL (ref 0.350–4.500)

## 2010-12-07 LAB — PROTIME-INR
INR: 2.06 — ABNORMAL HIGH (ref 0.00–1.49)
Prothrombin Time: 23.2 seconds — ABNORMAL HIGH (ref 11.6–15.2)

## 2010-12-07 LAB — LIPID PANEL
Cholesterol: 152 mg/dL (ref 0–200)
LDL Cholesterol: 91 mg/dL (ref 0–99)
Triglycerides: 110 mg/dL (ref <150)

## 2010-12-07 LAB — TROPONIN I: Troponin I: 0.02 ng/mL (ref 0.00–0.06)

## 2011-02-02 NOTE — Consult Note (Signed)
NAME:  ARRIN, PINTOR NO.:  [id number]   MEDICAL RECORD NO.:  [id number]           PATIENT TYPE:   LOCATION:                                 FACILITY:   PHYSICIAN:  Rose Phi. Myna Hidalgo, M.D. DATE OF BIRTH:  11-04-1927   DATE OF CONSULTATION:  11/20/2007  DATE OF DISCHARGE:                                 CONSULTATION   REFERRING PHYSICIAN:  Madolyn Frieze. Jens Som, MD, FACC   REASON FOR CONSULTATION:  1. CLL.  2. Microcytic anemia.   HISTORY OF PRESENT ILLNESS:  Mr. Prien is a very nice 77 year old  white gentleman who is followed by Dr. Park Breed at our [facility] office.  He has a 4-year history of chronic lymphocytic leukemia. He never  required any kind of treatment for this. Going through his office  records, his baseline white cell count has been around 35,000.  Apparently  now admitted to [facility] from [facility] with  what appears to be a stroke. He was admitted on the 28th.   On admission, his white cell count was 34.2, hemoglobin 8.5, hematocrit  25.2, platelet count 202.   According to the family, the patient states that he has been having  black stools for the past two weeks.   His ferritin done on March 1st was quite low at 11.   He had a CBC done today which showed a white cell count of 28,  hemoglobin 8.5, and platelet count of 192. Again, he did get two units  of packed red blood cells.   A followup CBC done this afternoon, I think, maybe after the blood  transfusion showed hemoglobin of 8.8.   We were asked to see him to see why he does not respond to the blood  transfusion.   He has not had any hemoptysis. He does take one aspirin a day. He does  not take really any other medication that would lead to gastritis.   He has undergone an MRI of the brain. The results are not back yet.   He apparently had some studies done at [facility] which we do not  have available yet.   He really cannot give too much more of the history  to me. His history is  given by his family.   From what I can tell, he might have been placed on Lovenox.   PAST MEDICAL HISTORY:  1. Hypertension.  2. TIA.  3. Appendectomy.  4. CLL.   ALLERGIES:  None.   MEDICATIONS ON ADMISSION:  1. Plavix 75 mg q. day.  2. Hydrochlorothiazide 12.5 mg q. day.   SOCIAL HISTORY:  Negative for tobacco or alcohol use. He used to work at  __________ .   FAMILY HISTORY:  Noncontributory for any obvious malignancy.   PHYSICAL EXAMINATION:  This is an elderly white gentleman in no obvious  distress.  VITAL SIGNS:  Temperature 98.4, pulse 73, respiratory rate 17, blood  pressure 130/70.  HEAD AND NECK:  Normocephalic, atraumatic skull. There are no ocular or  oral lesions. He has no palpable cervical or supraclavicular lymph  nodes. His  conjunctivae are slightly pale.  NECK:  Supple with no adenopathy.  LUNGS:  Clear bilaterally.  CARDIAC:  Regular rate and rhythm with normal S1, S2. He has no murmurs,  rubs, or bruits.  ABDOMINAL:  Soft with good bowel sounds. There is no guarding or rebound  tenderness. There is no obvious abdominal mass. There is no fluid wave.  There is no palpable hepatosplenomegaly.  BACK:  No tenderness of the spine, ribs, or hips.  EXTREMITIES:  No clubbing, cyanosis, or edema.   IMPRESSION:  Mr. Loman is a 77 year old gentleman with chronic  lymphocytic leukemia. This has been stable for the most part.   I would really have to wonder about the possibility of gastrointestinal  bleeding. He has not had his stools tested yet. Unfortunately for some  unknown reason, doctors cannot check stools for blood so we have to wait  for him to go to the bathroom.   However, given the ferritin of 11, one has to believe that he is  bleeding and is iron deficient. This certainly would account for him and  his anemia. This also may account for him not being able to have a  response to the blood transfusion.   I do not see any  evidence of hemolysis. We will certainly check a  reticulocyte count on him.   He is on Lovenox. I think this may have to be stopped right now until we  get the gastrointestinal issue sorted out.   Of note, there is a definite increased incidence of malignancies in  patients who have chronic lymphocytic leukemia. As such, I think Mr.  Monestime needs to be looked at from the upper and from the lower  gastrointestinal tract.   His white cell count is not a cause for his transient ischemic  attack/cerebrovascular accident. His lymphoid cells are not sticky and  do not cause sludging like we see with myeloid leukemia.   He certainly does not need any treatment for the chronic lymphocytic  leukemia right now.   He may need to be on iron. I certainly would advocate putting him on  some iron.   Again, I think Mr. Detty needs to have his gastrointestinal tract  looked at. I do worry about the possibility of gastrointestinal  bleeding. I think having him on Lovenox right now is a little bit risky,  and I would certainly get him off the Lovenox until we identify any  potential gastrointestinal blood loss.   We will certainly follow Mr. Gangemi along. He is a real nice guy. I got  the information from his family who certainly was very, very helpful.      Rose Phi. Myna Hidalgo, M.D.  Electronically Signed     PRE/MEDQ  D:  11/20/2007  T:  11/20/2007  Job:  [id number]   cc:   Madolyn Frieze. Jens Som, MD, [facility]  Leafy Ro, Dr.  Janalyn Shy P. Pearlean Brownie, MD

## 2011-02-02 NOTE — Consult Note (Signed)
NAME:  Ernest Kramer, Ernest Kramer             ACCOUNT NO.:  [id number]   MEDICAL RECORD NO.:  [id number]          PATIENT TYPE:  INP   LOCATION:  3729                         FACILITY:  [facility]   PHYSICIAN:  Pramod P. Pearlean Brownie, MD    DATE OF BIRTH:  08-28-1928   DATE OF CONSULTATION:  11/20/2007  DATE OF DISCHARGE:                                 CONSULTATION   REASON FOR REFERRAL:  TIA.   HISTORY OF PRESENT ILLNESS:  Ernest Kramer is a 77 year old Caucasian male  who was admitted on November 18, 2007 for evaluation for chest pain and  new onset of bundle-branch block.  This morning, he was noticed to be  confused with speech and language difficulties.  When his daughter  called him, she could not understand him, and the patient was having  trouble speaking.  She arrived there within half an hour and the  symptoms were persistent, but they gradually got better and now he is  almost back to his baseline.  The patient's family also noticed that he  had some right facial droop, as well.  In the night, the nurses had  reported that the patient was a little disoriented.  The patient has had  a previous history of TIA in 2002 when he had right facial numbness at  that time.  He was seen by Dr. Sandria Manly.  He was on aspirin, and Plavix in  addition was recommended.  The patient has in the interim been diagnosed  with chronic lymphatic leukemia.  He is currently not on specific  treatment for that.  He has not had any recent loss of consciousness,  seizures or other significant neurological problems.   PAST MEDICAL HISTORY:  1. Chronic lymphocytic leukemia.  2. TIA in 2002.  3. Hypertension.   ALLERGIES TO MEDICATIONS:  None known.   HOME MEDICATIONS:  1. Hydrochlorothiazide 12.5 mg daily.  2. Plavix 75 mg a day.   PAST SURGICAL HISTORY:  Appendicectomy surgery.   SOCIAL HISTORY:  The patient is retired.  He lives in [place] with his wife  and does not smoke or drink.  He is independent in activities of  daily  living.   PHYSICAL EXAMINATION:  GENERAL:  A pleasant elderly Caucasian male who  is at present not in distress.  VITAL SIGNS:  He is afebrile today with blood pressure of 143/65,  respiratory rate 20 per minute, pulse 100 per minute and regular,  saturations 95% on 2 liters.  HEENT:  Head is nontraumatic.  ENT exam unremarkable.  NECK:  Supple.  There is no bruit.  CARDIAC:  No murmur or gallop.  LUNGS:  Clear to auscultation.  NEUROLOGIC:  The patient is pleasant, awake, alert, cooperative.  He has  slight nonfluent speech, though he can speak quite clearly.  He has only  occasional paraphasic errors.  He can name, repeat and comprehend quite  well.  He follows 3-step commands.  Eye movements are full range.  There  is no nystagmus.  He has adequate visual fields and acuity.  Face is  symmetric.  Tongue is midline.  Motor system  exam reveals no upper  extremity drift, symmetric strength, tone, reflexes, coordination.  His  gait was not tested.  His NIH stroke scale is zero.   DATA REVIEWED:  CT scan of the head apparently is unremarkable.  WBC  count today is 28.2.   IMPRESSION:  A 77 year old gentleman with a transient episode of  confusion, speech and language difficulties likely a left hemispheric  transient ischemic attack versus small infarct.  The patient has anemia  and chronic lymphocytic leukemia.   PLAN:  I would recommend further evaluation with MRI scan of the brain  with MRA of the brain, carotid transcranial Doppler studies, 2-D echo  and check hemoglobin A1c and homocysteine.  Continue antiplatelet  therapy for now.  If there is no definite cardiac indication for Plavix  consider switching to Aggrenox.  I will be happy to follow the patient  in consult.  Also, optimize his hydration status, as well as hematocrit,  blood transfusion.  Because of his chronic leukemia, he may have some  restricted flow at the microcirculatory level which may be contributing   to TIA, as well.  I had a long discussion with the patient and his  daughter and answered questions.           ______________________________  Sunny Schlein. Pearlean Brownie, MD     PPS/MEDQ  D:  11/20/2007  T:  11/20/2007  Job:  [id number]

## 2011-02-02 NOTE — Assessment & Plan Note (Signed)
[facility]                         GASTROENTEROLOGY OFFICE NOTE   NAME:Ernest Kramer, Ernest Kramer                    MRN:          [id number]  DATE:01/15/2008                            DOB:          04-08-1928    Ernest Kramer returns following hospitalization for a bleeding lesion in  his proximal jejunum.  The lesion had endoscopic features compatible  with a Dieulafoy's lesion which was treated with epinephrine injection  and endoclip placement with hemostasis achieved, and he was discharged.  He was rehospitalized from December 21, 2007 through December 26, 2007 for  bilateral pulmonary emboli with bilateral DVTs  I have reviewed the  discharge summary which is included on his chart.  He relates no  abdominal complaints since discharge. He has not noted any rectal  bleeding, melena, abdominal pain, nausea or vomiting.  He states he has  had a followup visit with his primary care physician, and his hemoglobin  is now 11.8.   CURRENT MEDICATIONS:  Listed on the chart, updated and reviewed.   MEDICATION ALLERGIES:  None known.   PHYSICAL EXAMINATION:  No acute distress.  Height 6 feet 4 inches.  Weight 220.8 pounds.  Blood pressure is 130/66, pulse 76 and regular.  HEENT:  Anicteric sclerae.  Oropharynx clear.  CHEST:  Clear to auscultation bilaterally.  CARDIAC:  Regular rate and rhythm without murmurs appreciated.  ABDOMEN:  Soft, nontender, nondistended.  Normoactive bowel sounds.  No  palpable organomegaly, masses or hernias.   ASSESSMENT/PLAN:  Recent gastrointestinal bleed secondary to a jejunal  Dieulafoy's lesion.  He has a mild posthemorrhagic anemia that is  improving.  I will release him to the care of his primary physician.  I  will see back on referral.     Ernest Kramer. Ernest Dar, MD, [facility]  Electronically Signed    MTS/MedQ  DD: 01/15/2008  DT: 01/15/2008  Job #: [id number]   cc:   Ernest Mina, MD

## 2011-02-02 NOTE — H&P (Signed)
NAME:  Ernest Kramer, Ernest Kramer NO.:  [id number]   MEDICAL RECORD NO.:  [id number]          PATIENT TYPE:  INP   LOCATION:  3729                         FACILITY:  [facility]   PHYSICIAN:  Learta Codding, MD,FACC DATE OF BIRTH:  [birth year]   DATE OF ADMISSION:  11/18/2007  DATE OF DISCHARGE:                              HISTORY & PHYSICAL   PRIMARY CARDIOLOGIST:  New, being seen by Dr. Andee Lineman on admission,  November 18, 2007,   PRIMARY CARE PHYSICIAN:  Jerl Mina, MD.   NEUROLOGY:  Genene Churn. Love, MD.   Mr. Dillenbeck is a 77 year old Caucasian gentleman with no known history  of coronary artery disease who presented initially to [facility] complaining of chest discomfort with palpitations and  lightheadedness.  Mr. Stollings has a history of mild hypertension and  questionable history of vertigo and chronic lymphocytic leukemia  followed by Dr. Welton Flakes.  Daughter states leukemia has been stable for  several years.  Mr. Mincey states he is normally very active.  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  Stated he felt good, went to bed last night, felt like he was having  some palpitations in bed.  Did not think too much of at it.  This  morning he woke up because of a sensation like he states sledgehammer  laying on dead center of his chest.  He states he got lightheaded with  this. He got up and took his medicines, continued to feel lightheaded  and dizzy associated with heaviness in his chest.  He presented [place]  where EKG was done that showed sinus rhythm with a right bundle branch  block, unclear if this is a new finding.  No old EKG for comparison. EKG  was concerning for pulmonary emboli. Apparently the patient underwent a  CT scan at [facility] that showed no acute findings.  The patient was also  found to be orthostatic with a blood pressure standing of 100/66,  after  lying at 152/74.  He was given IV fluids  bolus. States this seemed to  help some.  Family requested the patient be brought to [facility] for  further evaluation.   PAST MEDICAL HISTORY:  1. Remote history of a TIA in 2002 followed by Dr. Sandria Manly.  2. Mild hypertension treated with hydrochlorothiazide.  3. Leukemia followed by Dr. Welton Flakes at [facility].  4. Vertigo with questionable inner ear problems, previous workup.  5. Status post appendectomy.  6. Status post right and left hernia repairs.   ALLERGIES:  No known allergies.   MEDICATIONS:  1. Hydrochlorothiazide 12.5.  2. Plavix 75.   SOCIAL HISTORY:  The patient lives in [place] with his wife.  He is retired  from [employer].  Has adult children.  Denies any tobacco, EtOH, drug, or  herbal medication use.  Tries to the heart-healthy diet.   FAMILY HISTORY:  Mother deceased of unclear cause.  Father deceased of  old age.   REVIEW OF SYSTEMS:  Positive for dizziness, chest pain, shortness of  breath, dyspnea on exertion, some peripheral edema,  palpitations,  choking sensation in throat associated with chest discomfort, and  palpitations.   PHYSICAL EXAMINATION:  VITAL SIGNS:  Temperature 97.6, pulse 78,  respirations 20, blood pressure 148/78, saturation 97% on 2 liters.  GENERAL:  No acute distress.  HEENT:  Normal.  NECK:  Supple without lymphadenopathy, bruits or JVD.  CARDIOVASCULAR:  Exam reveals S1-S2, regular rate and rhythm.  LUNGS:  Clear to auscultation bilaterally.  SKIN:  Warm and dry.  ABDOMEN:  Soft, nontender, positive bowel sounds.  EXTREMITIES:  Lower Extremities: The patient has 1+ pitting edema  bilaterally.  NEUROLOGIC:  Alert and oriented x3.   Chest x-ray done at [facility] showing no acute  findings.   EKG done at [facility] showing sinus rhythm with a right bundle branch  block and a rate of 95.   Lab work here is pending.  The patient had lab work done at [facility] as  follows. A urinalysis negative. Glucose 144, BUN 36,  creatinine 1.3,  sodium 143, potassium 3.8.  First set of cardiac enzymes:  Troponin less  than 0.1.  CK total 25, CK-MB 0.7. WBC 32.8, hemoglobin 10, hematocrit  29.7, platelets 241,000.   IMPRESSION:  1. Chest pain with right bundle branch block, unclear if new finding.      The patient is currently pain free.  Will admit to telemetry, cycle      enzymes, prophylaxis with Lovenox.  Continue Plavix and      hydrochlorothiazide.  Add aspirin. Hold on beta blocker at this      time secondary to hypotension  2. Dizziness, appears to be chronic finding for the patient. Could be      secondary to increased blood viscosity with elevated WBC-leukemia.      Will also obtain 2-D echocardiogram.  Check bilateral blood      pressures. Check orthostatics.   Dr. Lewayne Bunting in to examine and assess the patient, agrees with plan of  care.      Dorian Pod, ACNP      Learta Codding, MD,FACC  Electronically Signed    MB/MEDQ  D:  11/18/2007  T:  11/19/2007  Job:  [id number]   cc:   Jacquenette Shone, MD

## 2011-02-02 NOTE — Discharge Summary (Signed)
NAME:  Ernest Kramer, Ernest Kramer NO.:  [id number]   MEDICAL RECORD NO.:  [id number]          PATIENT TYPE:  INP   LOCATION:  3009                         FACILITY:  [facility]   PHYSICIAN:  Madaline Savage, MD        DATE OF BIRTH:  [birth year]   DATE OF ADMISSION:  11/29/2007  DATE OF DISCHARGE:  12/02/2007                               DISCHARGE SUMMARY   PRIMARY CARE PHYSICIAN:  Dr. Mickel Baas, in [place].   CONSULTATIONS:  He was seen by Dr. Christella Hartigan from gastroenterology.   DISCHARGE DIAGNOSES:  1. Acute upper gastrointestinal bleed likely from gastric ulcers.  2. Positive for Helicobacter pylori in gastric ulcers, on treatment.  3. Chronic lymphocytic leukemia.  4. History of recent transient ischemic attack.  5. Mild renal insufficiency, which has resolved.   DISCHARGE MEDICATIONS:  1. Protonix 40 mg twice daily.  2. Clarithromycin 500 mg twice daily for 6 more days.  3. Ampicillin 1000 mg twice daily for 6 more days.  4. Aspirin 81 mg once daily for 2 weeks.  After 2 weeks, we will stop      aspirin and start on Aggrenox 1 capsule twice daily.  5. Hydrochlorothiazide 12.5 mg once daily.   HISTORY OF PRESENT ILLNESS:  For full history and physical, see the  history and physical dictated by Dr. Darene Lamer on December 02, 2007.  In short,  Ernest Kramer is a 77 year old gentleman who was recently discharged from  the hospital, who comes back with low hemoglobin.  During his prior  hospital stay, he was found to have gastric ulcers, but at that time his  antiplatelet medications were changed to Aggrenox at the recommendation  of neurology as he had a transient ischemic attack.  His blood count on  admission was 6.1.  He was admitted and gastroenterology was consulted.   PROCEDURES DONE IN THE HOSPITAL:  He had an upper endoscopy done by Dr.  Russella Dar on November 30, 2007, which showed gastritis, which is unspecified,  erosions present in the antrum to pyloric  sphincter, erythematous  mucosa, and the ulcers appear to have healed.  There was a Dieulafoy  lesion in the proximal jejunum which started bleeding with APC  treatment.  Hemostasis was achieved following epinephrine injection and  2 EndoClip placements.   HOSPITAL COURSE:  1. GI bleed.  His hemoglobin dropped from approximately 9.5 to 6, and      it is presumed that his bleeding was exacerbated by the Aggrenox.      At this time, he has been watched in the hospital, and his      hemoglobin has been stable for the last few days.  At this time, he      is cleared for discharge by gastroenterology.  Gastroenterology      recommends to have no Aggrenox for 2 weeks.  Dr. Russella Dar has okayed      him to have aspirin 81 mg once daily.  We will put him on aspirin      81 mg once daily for 2 weeks and then change  to Aggrenox 1 capsule      twice daily after that.  We also recommended him to get a CBC count      in 5 to 7 days and follow with his primary care doctor.  2. Chronic lymphocytic leukemia.  His white count has been stable.  He      needs to follow up with his oncologist as an outpatient with a      regular appointment.   DISPOSITION:  He has now been discharged home in a stable condition.   FOLLOWUP:  He is asked to follow up with his primary care doctor, Dr.  Lockie Pares, in 1 week.  He is also asked to follow up with Dr. Christella Hartigan on  January 02, 2008, at 11 a.m.  He has been given Dr. Christella Hartigan' phone number  [phone].      Madaline Savage, MD  Electronically Signed     PKN/MEDQ  D:  12/02/2007  T:  12/03/2007  Job:  [id number]

## 2011-02-02 NOTE — Discharge Summary (Signed)
NAME:  Ernest Kramer, Ernest Kramer NO.:  [id number]   MEDICAL RECORD NO.:  [id number]          PATIENT TYPE:  INP   LOCATION:  4714                         FACILITY:  [facility]   PHYSICIAN:  Altha Harm, MDDATE OF BIRTH:  1928/05/07   DATE OF ADMISSION:  12/21/2007  DATE OF DISCHARGE:  12/26/2007                               DISCHARGE SUMMARY   DISCHARGE DISPOSITION:  Home.   FINAL DISCHARGE DIAGNOSES:  1. Bilateral pulmonary embolus.  2. Bilateral deep venous thrombosis.  3. Iron deficiency anemia.  4. History of gastrointestinal bleeding secondary to Dieulafoy lesion,      now well healed  5. History of chronic lymphocytic leukemia, stage 0.  6. Peptic ulcer disease.  7. Helicobacter pylori in the past, fully treated.  8. History of hypertension.  9. History of transient ischemic attacks in the past.  10.History of migraines.  11.Status post right and left hernia repairs in the past.  12.Status post appendectomy in the past.   DISCHARGE MEDICATIONS:  1. Hydrochlorothiazide 12.5 mg p.o. daily.  2. Protonix 40 mg p.o. b.i.d.  3. Simvastatin 40 mg p.o. daily.  4. Iron 325 mg p.o. daily.  5. Multivitamin 1 tab p.o. daily.  6. Coumadin 5 mg p.o. daily.  7. Lovenox 100 mg subcu b.i.d. x1 day.   DISCONTINUED MEDICATIONS:  1. Aspirin 81 mg p.o. daily.  2. Aggrenox 1 tablet p.o. daily.   CONSULTANTS:  Dr. Leone Payor, gastroenterology.   PROCEDURES:  Colonoscopy to cecum which showed sigmoid diverticulosis.  No other abnormalities of the colon.   Gastroenterology felt that having reviewed his old records, the  Dieulafoy lesion was healed, and there was no contraindication to  starting Coumadin.   DIAGNOSTIC STUDIES:  1. Doppler of the bilateral lower extremities which showed DVT in the      bilateral popliteal veins and the right tibial vein.  2. Carotid duplex which shows 40-60% ICA stenosis bilaterally.   ALLERGIES:  No known drug allergies.   CODE STATUS:   Full code.   CHIEF COMPLAINT:  Shortness of breath.   HISTORY OF PRESENT ILLNESS:  Please see the H&P done by Dr. Ephriam Knuckles  for details of the HPI.   HOSPITAL COURSE:  The patient presented to the emergency room after he  had a diagnosis of pulmonary emboli made at an outside hospital.  The  patient was initially scheduled for IVC filter, as the admitting  physician assumed an absolute contraindication to anticoagulation.  Gastroenterology was consulted to further evaluate his relative versus  absolute contraindication for Coumadin, and they felt that it was okay  to start Coumadin on this patient.  There was some question about  whether or not the patient should be on Coumadin versus lifelong Lovenox  versus Arixtra given the fact that he has a history of CLL.  The patient  does have a stage 0 CLL, and this would not contribute to his formation  of DVTs and pulmonary embolus.  I discussed it with Dr. Welton Flakes, his  oncologist, who agreed that this patient would be treated conventionally  with Coumadin for his DVT and  pulmonary embolus.  Thus, the patient was  started on Coumadin and was hospitalized until the Coumadin was  therapeutic to evaluate for any risk of bleeding in the patient.  The  patient today has an INR of 2.4, and there has been no evidence of  bleeding.  The patient is being sent home on Coumadin 5 mg daily with an  overlap of Lovenox for 1 day.  The patient is to follow up with Dr. Welton Flakes  for continued management of his DVT and pulmonary embolus.  The patient  can certainly have his Coumadin levels followed at his primary care  physician, Dr. Lockie Pares, in [place]; however, the patient still needs to  follow up with Dr. Welton Flakes to further follow up on the studies of the  hypercoagulable panel that was done.  The patient did have a  homocysteine level also performed which came back normal.  There is no  need for folate intervention at this time.  The plan for the patient is  for  him to continue with Coumadin, follow up on an ongoing basis with  Dr. Lockie Pares for maintaining therapeutic levels.  The patient is also to  follow up with Dr. Welton Flakes for further evaluation based upon the results of  the hypercoagulable panel.  I have discussed the need for Aggrenox and  aspirin with Dr. Pearlean Brownie from neurology who feels that unless there is a  cardiac reason, the patient should not continue on Aggrenox and aspirin  in the presence of Coumadin at a therapeutic level.  Otherwise, the  patient has remained stable.  The patient is ambulatory without any  difficulty.  Today he is afebrile with a temperature of 97.9, a heart  rate of 64, blood pressure of 150/71, respiratory rate of 15.  His white  blood cell count is 19.4 which has remained stable in the hospital due  to his CLL.  His hemoglobin is 10.6, hematocrit 31.7, and his platelet  count 258.  His PT today is 26.9 and INR is 2.4.  In terms of followup,  the patient should follow up with his primary care physician, Dr.  Lockie Pares, at the [facility], to have his INR levels checked on  tomorrow, December 27, 2007.  The patient is also to follow up with Dr. Welton Flakes  in 1 week at her office for followup on the hypercoagulable panel.   DIETARY RESTRICTIONS:  The patient should be on a heart-healthy, low  cholesterol diet.   PHYSICAL RESTRICTIONS:  None.      Altha Harm, MD  Electronically Signed     MAM/MEDQ  D:  12/26/2007  T:  12/26/2007  Job:  [id number]   cc:   fax copy of H&P Dr. Lockie Pares, fax [phone]  Drue Second, MD

## 2011-02-02 NOTE — H&P (Signed)
NAME:  Ernest Kramer, Ernest Kramer NO.:  [id number]   MEDICAL RECORD NO.:  [id number]          PATIENT TYPE:  EMS   LOCATION:  MAJO                         FACILITY:  [facility]   PHYSICIAN:  Madaline Savage, MD        DATE OF BIRTH:  1928-02-25   DATE OF ADMISSION:  11/29/2007  DATE OF DISCHARGE:                              HISTORY & PHYSICAL   PRIMARY CARE PHYSICIAN:  Dr. Mickel Baas in [place].  This patient  is unassigned to [place].   CHIEF COMPLAINT:  Low hemoglobin.   HISTORY OF PRESENT ILLNESS:  Ernest Kramer is a 76 year old gentleman who  was recently admitted to [facility] on November 18, 2007 until  November 24, 2007 with the complaints of chest pain.  He was under the  cardiologist's service at that time, and at that time, he was found to  have a transient ischemic attack.  His antiplatelet drugs were changed  from Plavix to Aggrenox.  He was also found to have iron-deficiency  anemia, and Dr. Christella Hartigan was consulted, who did an upper endoscopy on  November 22, 2007 which revealed two clean-based prepyloric ulcers.  He was  also found to be H. pylori positive and was started on treatment for it  and was discharged on proton pump inhibitor twice daily.  Apparently,  after going home, he started feeling weak and tired in a couple of days.  He went to see his primary care doctor today and had a CBC done at the  primary care doctor's office and was found to have a hemoglobin of 5.1  and was sent to the emergency room here.  Here, he has a hemoglobin of  6.1.  He denies any chest pain or shortness of breath.  He does complain  of generalized weakness.   PAST MEDICAL HISTORY:  1. Peptic ulcer disease with an EGD done on November 22, 2007 which shows      two clean-based prepyloric ulcers.  2. History of transient ischemic attack.  3. Iron-deficiency anemia.  4. CLL.  5. History of migraines.  6. Right and left inguinal hernia repair.  7. Appendectomy.   ALLERGIES:  No known  drug allergies.   CURRENT MEDICATIONS:  1. He is on Protonix 40 mg twice daily.  2. Clarithromycin 500 mg twice daily.  3. Ampicillin 1000 mg twice daily.  4. Aspirin 81 mg daily.  5. Aggrenox 25/200 twice daily.  6. Hydrochlorothiazide 12.5 mg daily.  7. Chromagen 1 tab twice daily.  8. Zocor 20 mg daily.   SOCIAL HISTORY:  He lives with his wife in [place].  He denies any history  of smoking, alcohol, or drug abuse.   FAMILY HISTORY:  His mother is deceased of unclear cause.  Father is  deceased of old age.   REVIEW OF SYSTEMS:  He denies any recent weight loss or weight gain.  No  fevers or chills.  HEENT:  No headaches or blurred vision.  No sore  throat.  CARDIOVASCULAR:  Denies chest pain, palpitations.  RESPIRATORY:  No shortness of breath or cough.  GI:  No abdominal pain, nausea or  vomiting, diarrhea, or constipation.   PHYSICAL EXAMINATION:  He is alert and oriented x3.  VITALS:  Temperature is 99.2, pulse rate of 85, blood pressure 170/54,  respiratory rate 16.  Oxygen saturation 100% on 2 liters.  HEENT:  Head is normocephalic and atraumatic.  Pupils are equal, round  and reactive to light.  Mucous membranes are moist.  NECK:  Supple.  No JVD, no carotid bruits.  CARDIOVASCULAR:  S1 and S2 are heard.  Regular rate and rhythm.  CHEST:  Clear to auscultation.  ABDOMEN:  Soft.  Bowel sounds are heard.  EXTREMITIES:  No clubbing, cyanosis or edema.   Labs show a white count of 39.5, hemoglobin 6.1.  His last hemoglobin  was 9.5 on November 24, 2007.  Platelets are 274.  Sodium 136, potassium  3.3, chloride 103, bicarb 24, BUN 35, creatinine 1.3.  Fecal occult  blood testing is positive.  INR is 1.2.  Troponin less than 0.05.  CK-MB  less than 1.   IMPRESSION:  1. Severe anemia secondary to gastrointestinal bleed.  2. Recent endoscopy which showed two prepyloric gastric ulcers.  3. Recent transient ischemic attack.  4. Chronic lymphocytic leukemia.  5. Positive for  Helicobacter pylori.  6. Hyperlipidemia.   PLAN:  This is a 77 year old gentleman who was recently discharged from  the hospital, who comes back with low hemoglobin.  He has fecal occult  blood testing which is positive.  I will admit him to the step-down unit  and transfuse a total of 4 units of packed cells.  GI has been  consulted, who is seeing him in the emergency room.  He will need a  repeat endoscopy.  I will hold his aspirin and Aggrenox at this time and  follow his H&H.  I will put him on SCDs for DVT prophylaxis.      Madaline Savage, MD  Electronically Signed     PKN/MEDQ  D:  11/29/2007  T:  11/29/2007  Job:  [id number]

## 2011-02-02 NOTE — Discharge Summary (Signed)
NAME:  Ernest Kramer, Ernest Kramer NO.:  [id number]   MEDICAL RECORD NO.:  [id number]          PATIENT TYPE:  INP   LOCATION:  3728                         FACILITY:  [facility]   PHYSICIAN:  Jonelle Sidle, MD DATE OF BIRTH:  12/28/1927   DATE OF ADMISSION:  11/18/2007  DATE OF DISCHARGE:  11/24/2007                               DISCHARGE SUMMARY   PRIMARY CARE Linda Grimmer:  Dr. Jerl Mina in [place].   NEUROLOGIST:  Evie Lacks, M.D.   DISCHARGE DIAGNOSIS:  Chest pain.   SECONDARY DIAGNOSES:  1. Iron-deficiency anemia.  2. Presumed gastrointestinal bleed.  3. Chronic lymphocytic leukemia.  4. Peptic ulcer disease with esophagogastroduodenoscopy this admission      showing two clean based pre-pyloric ulcers.  5. Helicobacter pylori positive.  6. Hypertension.  7. Transient ischemic attack this admission with prior history as      well.  8. History of migraines.  9. Status post right and left hernia repairs.  10.Status post appendectomy.   ALLERGIES:  No known drug allergies.   PROCEDURES:  1. A 2D echocardiogram November 20, 2007, revealing an EF of 55-60%      without regional wall motion abnormalities.  Mild AI and MR.  2. November 21, 2007, adenosine Myoview, EF 73% with inferolateral defect      from the mid to basal level which was mild and fixed overall and      may represent attenuation artifact.  Overall, felt to be a low risk      study.  3. EGD performed November 22, 2007, revealing two clean based prepyloric      ulcers.   HISTORY OF PRESENT ILLNESS:  A 77 year old Caucasian male with the above  problem list who presented to [facility] Emergency Room on  November 18, 2007 secondary to a sudden onset of substernal chest  heaviness associated with lightheadedness that woke him from sleep that  morning.  ECG showed sinus rhythm with a right bundle branch block and  the patient was found to be orthostatic by blood pressure.  He was  treated with IV  fluids and transferred to [facility] for  further evaluation.   HOSPITAL COURSE:  The patient rule out for MI and was found to be anemic  on March 1st with a hemoglobin of 8 and hematocrit of 23.9.  He was  transfused with 2 units of packed red blood cells with a rise in his H&H  to 8.8 and 26.2.  On March 2nd, the patient was found to be confused  with mild dysarthria and a CT of the head was obtained.  This showed no  acute findings.  Neurology was consulted and the patient was seen by Dr.  Pearlean Brownie.  It was felt that he likely had suffered from a TIA as his  symptoms did resolve.  MRI/MRA of the brain was performed showing  chronic ischemic white matter changes bilaterally with a small chronic  cortical infarct in the left posterior frontal cortex.  There was no  hemorrhaging.  MRA was normal.  Neurology recommended Aggrenox therapy  if there  was no cardiac indication for continuation of Plavix.  With the  patient's anemia and history of chronic lymphocytic leukemia, hematology  was consulted and he was seen by Dr. Myna Hidalgo.  It was felt that there  were no acute issues with regards to his leukemia at this time.  He did  receive 2 additional units of blood on March 3rd and then again on March  5th for drops in hemoglobin and hematocrit to the 7 and 23 range.  The  patient's stool was tested and was positive for fecal occult blood.  GI  was consulted and he underwent EGD on March 4th, showing two clean based  prepyloric ulcers.  There was no active bleeding; however, it was felt  that these ulcers were likely the source of his melena and anemia.  H.  pylori testing was also positive and he has been initiated on  clarithromycin and ampicillin therapy.  He will also remain on Protonix  b.i.d. for 2 weeks and then daily going forward.  GI has arranged for  outpatient followup and subsequent EGD in approximately 2 months to  check for ulcer resolution.  It was not felt that the patient  required  colonoscopy during this admission; however, further discussion would be  had as an outpatient.   From a cardiac standpoint, it was felt that Mr. Colina chest pain may  have been demand ischemia in the setting of significant anemia.  We were  finally able to perform an adenosine Myoview on March 3rd which was felt  to be low risk overall with an EF of 73%.  At this point, he does not  require any additional cardiac testing or followup.  Mr. Littler will be  discharged home today in good condition.   DISCHARGE LABORATORY:  Hemoglobin 9.5, hematocrit 28.3, WBC 25.7,  platelets 198.  Sodium 143, potassium 3.4, chloride 109, CO2 29, BUN 28,  creatinine 1.27, glucose 121.  Total bilirubin 0.9, alkaline phosphatase  44, AST 8, ALT 15, total protein 5.1, albumin 3.1, calcium 8.2.  Hemoglobin A1c 5.2.  CK 29, MB 1.2, troponin I 0.03.  Total cholesterol  134, triglycerides 117, HDL 21, LDL 90.  Serum iron 32, TIBC 219, B12  281, folate 864, ferritin 11.  Homocystine 12.6.  Fecal occult blood was  positive.  H. pylori screen was positive.   DISPOSITION:  The patient is being discharged home today in good  condition.   FOLLOWUP PLANS/APPOINTMENTS:  1. We have asked the patient to follow up with Dr. Burnett Sheng next week      for followup CBC.  2. He is to follow up with Dr. Rachael Fee with [facility] on      April 14th at 11 a.m.  3. The patient is to contact Dr. Sandria Manly for followup neurology      appointment in approximately 6 months.   DISCHARGE MEDICATIONS:  1. Protonix 40 mg b.i.d. for 14 days, then daily.  2. Clarithromycin 500 mg b.i.d. for 14 days.  3. Ampicillin 1000 mg b.i.d. for 14 days.  4. Aspirin 81 mg daily.  5. Aggrenox 25/200 mg b.i.d.  6. HCTZ 12.5 mg daily.  7. Chromagen Forte 1 tab b.i.d.  8. Simvastatin 20 mg nightly.   OUTSTANDING LABORATORY STUDIES:  None.   DURATION OF DISCHARGE ENCOUNTER:  Sixty minutes including physician  time.       Nicolasa Ducking, ANP      Jonelle Sidle, MD  Electronically Signed  CB/MEDQ  D:  11/24/2007  T:  11/24/2007  Job:  [id number]   cc:   Emmaline Kluver, MD  Evie Lacks, MD

## 2011-02-02 NOTE — H&P (Signed)
NAME:  Ernest Kramer, BISPING NO.:  [id number]   MEDICAL RECORD NO.:  [id number]          PATIENT TYPE:  INP   LOCATION:  2919                         FACILITY:  [facility]   PHYSICIAN:  Hollice Espy, M.D.DATE OF BIRTH:  1927-11-19   DATE OF ADMISSION:  12/21/2007  DATE OF DISCHARGE:                              HISTORY & PHYSICAL   PRIMARY CARE PHYSICIAN:  Dr. Walden Field, [place].   GASTROENTEROLOGIST:  Dr. Lina Sar of [facility].   CHIEF COMPLAINT:  Shortness of breath.   HISTORY OF PRESENT ILLNESS:  The patient is 77 year old white male with  past medical history of CLL and TIA who was recently on the [facility] Service for an acute upper GI bleed from gastric ulcers of  positive H pylori.  His hospitalization was complicated by a brief TIA  episode.  The patient received multiple blood transfusion units and was  discharged home with his Aggrenox being held but on aspirin once daily.  About two days after he got home, he started complaining of some pain  over his right side of his chest underneath his axilla.  This pain  persisted and he started  getting somewhat mildly short of breath.  He  followed up with his PCP today who ordered a chest x-ray which showed (  this is through a report, not the actual report), reportedly she had  some signs concerning for possibility of an infarction.  He then was  sent over to the emergency room at [facility] where a chest CT  noted signs consistent with bilateral pulmonary emboli.  At this point  the ER doctor from [place] apparently contacted a physician here at  [facility] for further treatment of this patient.  There was  apparently some sort of error in communication and initially the doctor  on call for [place] was contacted by the ER physician.  The doctor on  call from [place] assumed that the patient's primary physician was a  Adult nurse doctor and the patient was sent over to the ICU  under the care  of [facility] Service with that assumption.  After the patient was  seen in the ICU, the error was discovered.  Currently the patient is  doing okay.  He denies any headaches, vision changes, dysphagia.  No  chest pain currently or palpitations.  No wheezing or coughing and he  has no abdominal pain.  No hematuria, dysuria, constipation, diarrhea,  focal extremity numbness, weakness or pain.  Review of systems otherwise  negative.   PAST MEDICAL HISTORY:  1. Recent GI bleed.  2. Positive H pylori.  3. Secondary gastric ulcers.  4. CLL.  5. History of TIA.   MEDICATIONS:  Based on the patient's recent discharge summary:  1. Protonix 40 b.i.d.  2. Aspirin 81 mg daily times 7 more days when she will transition back      over to Aggrenox 1 capsule b.i.d.  3. HCTZ at 12.5 p.o. daily.   ALLERGIES:  NO KNOWN DRUG ALLERGIES.   SOCIAL HISTORY:  No tobacco, alcohol or drug use.  FAMILY HISTORY:  Noncontributory.   PHYSICAL EXAMINATION:  VITAL SIGNS:  Since he had been here on arrival  to [facility],  blood pressure 174/62.  He is afebrile.  Oxygen  saturation 94% on 2 liters, pulse 65, respirations 18.  GENERAL:  In general he is alert and healthy, in no apparent distress.  HEENT:  The patient is normocephalic, atraumatic.  Mucous membranes are  moist.  NECK:  He has no carotid bruits.  HEART:  Regular rate and rhythm.  S1 and S2.  LUNGS:  Clear to auscultation bilaterally.  ABDOMEN:  Soft, nontender, nondistended.  Positive bowel sounds.  EXTREMITIES:  No clubbing, cyanosis or edema.   LABORATORY DATA:  CD containing the patient's CT scan has been brought  over but the films has not yet been read here.  The lab work which was done at [facility], the patient's white  count is 23, hemoglobin 10.8, hematocrit 33, MCV 88, platelet count  295,000 with 25% only neutrophils.  Sodium 139, potassium 3.2, chloride  102, bicarb 31, BUN 12, creatinine  1.18, glucose 109.  INR is 1.1, PTT  34.  PT 14.4.  the patient's CT scan and chest report as read by  radiology there:  Numerous bilateral pulmonary emboli, small area of  infarction on the right, trace of pleural fluid present.  Cardiac  chambers normal in size.  No overt evidence of CHF with chronic  underlying COPD changes positive.   PLAN:  1. Pulmonary embolus, likely cause of this is history of CLL and      recent hospitalization where he was laid up in bed.  He was placed      on heparin for now.  Discussed with interventional radiology to get      IVC filter, then stop his heparin.  2. Recent  GI bleed.  Will go with IV Protonix and see above.  3. History of chronic lymphocytic leukemia.  4. History of transient ischemic attack.  Aggrenox currently on hold   Please note that I have spoken with the [facility] hospitalist doctor on  call and I will complete this admission and then the patient was  transferred to the [facility] hospitalist team G in the morning.      Hollice Espy, M.D.  Electronically Signed     SKK/MEDQ  D:  12/21/2007  T:  12/22/2007  Job:  [id number]

## 2011-04-06 ENCOUNTER — Encounter: Payer: Self-pay | Admitting: Cardiovascular Disease

## 2011-06-11 LAB — CARDIAC PANEL(CRET KIN+CKTOT+MB+TROPI): CK, MB: 1.3

## 2011-06-11 LAB — TSH: TSH: 2.648

## 2011-06-14 LAB — CBC
HCT: 25 — ABNORMAL LOW
HCT: 25.2 — ABNORMAL LOW
HCT: 25.5 — ABNORMAL LOW
HCT: 26.2 — ABNORMAL LOW
Hemoglobin: 10.1 — ABNORMAL LOW
Hemoglobin: 6.1 — CL
Hemoglobin: 7.9 — CL
Hemoglobin: 8.2 — ABNORMAL LOW
Hemoglobin: 8.4 — ABNORMAL LOW
Hemoglobin: 8.5 — ABNORMAL LOW
Hemoglobin: 9.5 — ABNORMAL LOW
Hemoglobin: 9.6 — ABNORMAL LOW
MCHC: 33.4
MCHC: 33.4
MCHC: 33.6
MCHC: 34
MCHC: 34
MCHC: 34
MCV: 91
MCV: 93.8
MCV: 94.7
MCV: 95.7
MCV: 96.6
MCV: 98.3
Platelets: 194
Platelets: 202
Platelets: 214
Platelets: 215
Platelets: 242
RBC: 1.91 — ABNORMAL LOW
RBC: 2.43 — ABNORMAL LOW
RBC: 2.48 — ABNORMAL LOW
RBC: 2.56 — ABNORMAL LOW
RBC: 2.59 — ABNORMAL LOW
RBC: 3.06 — ABNORMAL LOW
RBC: 3.09 — ABNORMAL LOW
RBC: 3.35 — ABNORMAL LOW
RDW: 16 — ABNORMAL HIGH
RDW: 16 — ABNORMAL HIGH
RDW: 17.4 — ABNORMAL HIGH
RDW: 17.7 — ABNORMAL HIGH
RDW: 17.8 — ABNORMAL HIGH
RDW: 17.8 — ABNORMAL HIGH
WBC: 23.8 — ABNORMAL HIGH
WBC: 25.7 — ABNORMAL HIGH
WBC: 26.4 — ABNORMAL HIGH
WBC: 32.1 — ABNORMAL HIGH
WBC: 34.2 — ABNORMAL HIGH
WBC: 38.4 — ABNORMAL HIGH

## 2011-06-14 LAB — LIPID PANEL
Cholesterol: 134
LDL Cholesterol: 90
Total CHOL/HDL Ratio: 6.4
Triglycerides: 117
VLDL: 23
VLDL: 24

## 2011-06-14 LAB — CROSSMATCH
ABO/RH(D): A POS
Antibody Screen: NEGATIVE

## 2011-06-14 LAB — BASIC METABOLIC PANEL
BUN: 21
BUN: 22
CO2: 24
CO2: 27
CO2: 27
CO2: 28
CO2: 28
Calcium: 7.7 — ABNORMAL LOW
Calcium: 7.8 — ABNORMAL LOW
Calcium: 8.1 — ABNORMAL LOW
Calcium: 8.2 — ABNORMAL LOW
Calcium: 8.4
Calcium: 8.6
Chloride: 104
Chloride: 105
Chloride: 107
Chloride: 110
Creatinine, Ser: 1.11
Creatinine, Ser: 1.11
Creatinine, Ser: 1.18
Creatinine, Ser: 1.19
Creatinine, Ser: 1.24
Creatinine, Ser: 1.25
GFR calc Af Amer: >60
GFR calc Af Amer: >60
GFR calc Af Amer: >60
GFR calc Af Amer: >60
GFR calc non Af Amer: 55 — ABNORMAL LOW
GFR calc non Af Amer: 59 — ABNORMAL LOW
GFR calc non Af Amer: 60 — ABNORMAL LOW
GFR calc non Af Amer: >60
Glucose, Bld: 114 — ABNORMAL HIGH
Glucose, Bld: 121 — ABNORMAL HIGH
Glucose, Bld: 122 — ABNORMAL HIGH
Glucose, Bld: 179 — ABNORMAL HIGH
Potassium: 3.5
Potassium: 3.9
Potassium: 4
Sodium: 139
Sodium: 140
Sodium: 141
Sodium: 142
Sodium: 143
Sodium: 143

## 2011-06-14 LAB — HEMOGLOBIN AND HEMATOCRIT, BLOOD
HCT: 25.3 — ABNORMAL LOW
HCT: 25.5 — ABNORMAL LOW
HCT: 27.4 — ABNORMAL LOW
Hemoglobin: 8.7 — ABNORMAL LOW
Hemoglobin: 9.1 — ABNORMAL LOW

## 2011-06-14 LAB — VITAMIN B12: Vitamin B-12: 281 (ref 211–911)

## 2011-06-14 LAB — COMPREHENSIVE METABOLIC PANEL
ALT: 17
AST: 18
Albumin: 3.1 — ABNORMAL LOW
Alkaline Phosphatase: 44
BUN: 30 — ABNORMAL HIGH
CO2: 24
CO2: 27
Calcium: 8.1 — ABNORMAL LOW
Chloride: 110
Creatinine, Ser: 1.14
Creatinine, Ser: 1.31
GFR calc Af Amer: >60
GFR calc non Af Amer: 53 — ABNORMAL LOW
GFR calc non Af Amer: >60
Glucose, Bld: 134 — ABNORMAL HIGH
Potassium: 3.1 — ABNORMAL LOW
Sodium: 136
Total Bilirubin: 0.9

## 2011-06-14 LAB — CARDIAC PANEL(CRET KIN+CKTOT+MB+TROPI)
Relative Index: INVALID
Relative Index: INVALID
Total CK: 26
Total CK: 29
Troponin I: 0.03

## 2011-06-14 LAB — FERRITIN: Ferritin: 11 — ABNORMAL LOW (ref 22–322)

## 2011-06-14 LAB — DIFFERENTIAL
Basophils Absolute: 0
Basophils Relative: 0
Basophils Relative: 0
Eosinophils Absolute: 0
Eosinophils Absolute: 0
Eosinophils Absolute: 0
Eosinophils Relative: 0
Eosinophils Relative: 0
Lymphocytes Relative: 65 — ABNORMAL HIGH
Lymphocytes Relative: 77 — ABNORMAL HIGH
Monocytes Absolute: 0.8
Monocytes Absolute: 2 — ABNORMAL HIGH
Monocytes Relative: 3
Neutro Abs: 11.9 — ABNORMAL HIGH
Neutro Abs: 5.1
Neutrophils Relative %: 20 — ABNORMAL LOW

## 2011-06-14 LAB — RETICULOCYTES
RBC.: 2.54 — ABNORMAL LOW
Retic Count, Absolute: 134.6
Retic Ct Pct: 7.6 — ABNORMAL HIGH

## 2011-06-14 LAB — OCCULT BLOOD X 1 CARD TO LAB, STOOL
Fecal Occult Bld: POSITIVE
Fecal Occult Bld: POSITIVE

## 2011-06-14 LAB — ABO/RH: ABO/RH(D): A POS

## 2011-06-14 LAB — IRON AND TIBC
Iron: 32 — ABNORMAL LOW
Saturation Ratios: 15 — ABNORMAL LOW
Saturation Ratios: 6 — ABNORMAL LOW
TIBC: 179 — ABNORMAL LOW
TIBC: 219
UIBC: 169
UIBC: 187

## 2011-06-14 LAB — POCT CARDIAC MARKERS: Operator id: 234501

## 2011-06-14 LAB — PREPARE RBC (CROSSMATCH)

## 2011-06-14 LAB — CLOTEST (H. PYLORI), BIOPSY: Helicobacter screen: POSITIVE — AB

## 2011-06-14 LAB — APTT
aPTT: 28
aPTT: 34

## 2011-06-14 LAB — FOLATE: Folate: >20

## 2011-06-15 LAB — BLOOD GAS, ARTERIAL
Acid-Base Excess: 5.5 — ABNORMAL HIGH
Bicarbonate: 28.9 — ABNORMAL HIGH
Patient temperature: 98.6
TCO2: 30.1
pCO2 arterial: 38
pH, Arterial: 7.494 — ABNORMAL HIGH

## 2011-06-15 LAB — CARDIOLIPIN ANTIBODIES, IGG, IGM, IGA
Anticardiolipin IgG: <7 — ABNORMAL LOW (ref <11)
Anticardiolipin IgM: 8 — ABNORMAL LOW (ref <10)

## 2011-06-15 LAB — CBC
HCT: 31.7 — ABNORMAL LOW
HCT: 32 — ABNORMAL LOW
HCT: 32.2 — ABNORMAL LOW
Hemoglobin: 10.4 — ABNORMAL LOW
Hemoglobin: 10.4 — ABNORMAL LOW
Hemoglobin: 10.6 — ABNORMAL LOW
Hemoglobin: 10.6 — ABNORMAL LOW
MCHC: 32.6
MCHC: 33
MCHC: 33
MCHC: 33.3
MCHC: 33.3
MCV: 87.4
MCV: 87.9
MCV: 87.9
MCV: 88
Platelets: 258
Platelets: 271
RBC: 3.59 — ABNORMAL LOW
RBC: 3.65 — ABNORMAL LOW
RBC: 3.66 — ABNORMAL LOW
RDW: 15.7 — ABNORMAL HIGH
RDW: 16 — ABNORMAL HIGH
RDW: 16.5 — ABNORMAL HIGH
WBC: 23.1 — ABNORMAL HIGH

## 2011-06-15 LAB — LUPUS ANTICOAGULANT PANEL
DRVVT: 65.9 — ABNORMAL HIGH (ref 36.1–47.0)
Lupus Anticoagulant: NOT DETECTED
PTTLA 4:1 Mix: 140.3 — ABNORMAL HIGH (ref 36.3–48.8)
dRVVT Incubated 1:1 Mix: 44.4 (ref 36.1–47.0)

## 2011-06-15 LAB — APTT: aPTT: 62 — ABNORMAL HIGH

## 2011-06-15 LAB — COMPREHENSIVE METABOLIC PANEL
AST: 21
CO2: 30
Calcium: 8.6
Chloride: 104
Creatinine, Ser: 1.09
GFR calc Af Amer: >60
GFR calc non Af Amer: >60
Glucose, Bld: 103 — ABNORMAL HIGH
Total Bilirubin: 0.7

## 2011-06-15 LAB — HEPARIN LEVEL (UNFRACTIONATED)
Heparin Unfractionated: 0.21 — ABNORMAL LOW
Heparin Unfractionated: 0.48
Heparin Unfractionated: 0.53

## 2011-06-15 LAB — PROTEIN C, TOTAL: Protein C, Total: 77 % (ref 70–140)

## 2011-06-15 LAB — BETA-2-GLYCOPROTEIN I ABS, IGG/M/A
Beta-2 Glyco I IgG: <4 U/mL (ref <20)
Beta-2-Glycoprotein I IgM: <4 U/mL (ref <10)

## 2011-06-15 LAB — PROTIME-INR
INR: 1.2
INR: 1.7 — ABNORMAL HIGH
Prothrombin Time: 16.4 — ABNORMAL HIGH

## 2011-06-15 LAB — ANTITHROMBIN III: AntiThromb III Func: 84 (ref 76–126)

## 2011-06-23 LAB — DIFFERENTIAL
Basophils Absolute: 0
Basophils Relative: 0
Eosinophils Absolute: 0
Monocytes Absolute: 1.8 — ABNORMAL HIGH
Neutrophils Relative %: 33 — ABNORMAL LOW

## 2011-06-23 LAB — URINE MICROSCOPIC-ADD ON

## 2011-06-23 LAB — URINALYSIS, ROUTINE W REFLEX MICROSCOPIC
Glucose, UA: NEGATIVE
Ketones, ur: 15 — AB
Protein, ur: NEGATIVE
pH: 5

## 2011-06-23 LAB — CBC
HCT: 43.2
Hemoglobin: 14.9
MCV: 104.6 — ABNORMAL HIGH
Platelets: 168
RBC: 4.12 — ABNORMAL LOW
WBC: 29.2 — ABNORMAL HIGH

## 2011-06-23 LAB — PATHOLOGIST SMEAR REVIEW

## 2011-06-23 LAB — COMPREHENSIVE METABOLIC PANEL
Albumin: 3.6
Alkaline Phosphatase: 69
BUN: 17
CO2: 27
Chloride: 99
GFR calc non Af Amer: 33 — ABNORMAL LOW
Glucose, Bld: 126 — ABNORMAL HIGH
Potassium: 3.5
Total Bilirubin: 3.5 — ABNORMAL HIGH

## 2011-12-12 ENCOUNTER — Ambulatory Visit: Payer: Self-pay | Admitting: Orthopedic Surgery

## 2012-03-20 ENCOUNTER — Inpatient Hospital Stay: Payer: Self-pay | Admitting: Specialist

## 2012-03-20 ENCOUNTER — Ambulatory Visit: Payer: Self-pay | Admitting: Oncology

## 2012-03-20 LAB — COMPREHENSIVE METABOLIC PANEL
Alkaline Phosphatase: 113 U/L (ref 50–136)
Bilirubin,Total: 1.5 mg/dL — ABNORMAL HIGH (ref 0.2–1.0)
Calcium, Total: 8.7 mg/dL (ref 8.5–10.1)
Co2: 20 mmol/L — ABNORMAL LOW (ref 21–32)
Creatinine: 1.57 mg/dL — ABNORMAL HIGH (ref 0.60–1.30)
Osmolality: 284 (ref 275–301)
Potassium: 3.7 mmol/L (ref 3.5–5.1)
Sodium: 141 mmol/L (ref 136–145)
Total Protein: 5.9 g/dL — ABNORMAL LOW (ref 6.4–8.2)

## 2012-03-20 LAB — PROTIME-INR
INR: 1.6
Prothrombin Time: 19 secs — ABNORMAL HIGH (ref 11.5–14.7)

## 2012-03-20 LAB — URINALYSIS, COMPLETE
Bilirubin,UR: NEGATIVE
Glucose,UR: NEGATIVE mg/dL (ref 0–75)
Specific Gravity: 1.02 (ref 1.003–1.030)
Squamous Epithelial: NONE SEEN
WBC UR: 1 /HPF (ref 0–5)

## 2012-03-20 LAB — TROPONIN I: Troponin-I: 0.03 ng/mL

## 2012-03-20 LAB — CBC
HGB: 13.9 g/dL (ref 13.0–18.0)
RBC: 4.05 10*6/uL — ABNORMAL LOW (ref 4.40–5.90)

## 2012-03-20 LAB — CK TOTAL AND CKMB (NOT AT ARMC)
CK, Total: 153 U/L (ref 35–232)
CK-MB: 3 ng/mL (ref 0.5–3.6)

## 2012-03-22 LAB — CBC WITH DIFFERENTIAL/PLATELET
Comment - H1-Com1: NORMAL
Lymphocytes: 53 %
MCH: 34.6 pg — ABNORMAL HIGH (ref 26.0–34.0)
MCV: 102 fL — ABNORMAL HIGH (ref 80–100)
Platelet: 126 10*3/uL — ABNORMAL LOW (ref 150–440)
RDW: 13.1 % (ref 11.5–14.5)
Segmented Neutrophils: 35 %
Variant Lymphocyte - H1-Rlymph: 10 %
WBC: 34.6 10*3/uL — ABNORMAL HIGH (ref 3.8–10.6)

## 2012-03-22 LAB — BASIC METABOLIC PANEL
BUN: 11 mg/dL (ref 7–18)
Chloride: 106 mmol/L (ref 98–107)
Co2: 27 mmol/L (ref 21–32)
Osmolality: 280 (ref 275–301)
Potassium: 4.2 mmol/L (ref 3.5–5.1)

## 2012-03-22 LAB — PROTIME-INR: Prothrombin Time: 17.7 secs — ABNORMAL HIGH (ref 11.5–14.7)

## 2012-03-23 LAB — CBC WITH DIFFERENTIAL/PLATELET
HCT: 33.8 % — ABNORMAL LOW (ref 40.0–52.0)
HGB: 10.6 g/dL — ABNORMAL LOW (ref 13.0–18.0)
Lymphocytes: 62 %
MCH: 34 pg (ref 26.0–34.0)
MCHC: 33.4 g/dL (ref 32.0–36.0)
MCV: 103 fL — ABNORMAL HIGH (ref 80–100)
MCV: 106 fL — ABNORMAL HIGH (ref 80–100)
Monocytes: 3 %
Platelet: 121 10*3/uL — ABNORMAL LOW (ref 150–440)
Platelet: 110 10*3/uL — ABNORMAL LOW (ref 150–440)
RBC: 3.12 10*6/uL — ABNORMAL LOW (ref 4.40–5.90)
RDW: 13.4 % (ref 11.5–14.5)
RDW: 12.9 % (ref 11.5–14.5)
Segmented Neutrophils: 29 %
Segmented Neutrophils: 25 %
WBC: 41.4 10*3/uL — ABNORMAL HIGH (ref 3.8–10.6)

## 2012-03-23 LAB — PROTIME-INR
INR: 1.9
Prothrombin Time: 22.3 secs — ABNORMAL HIGH (ref 11.5–14.7)

## 2012-03-23 LAB — BASIC METABOLIC PANEL
Anion Gap: 8 (ref 7–16)
BUN: 11 mg/dL (ref 7–18)
Calcium, Total: 7.7 mg/dL — ABNORMAL LOW (ref 8.5–10.1)
Calcium, Total: 7.8 mg/dL — ABNORMAL LOW (ref 8.5–10.1)
Chloride: 107 mmol/L (ref 98–107)
Chloride: 107 mmol/L (ref 98–107)
Co2: 24 mmol/L (ref 21–32)
Creatinine: 1.32 mg/dL — ABNORMAL HIGH (ref 0.60–1.30)
EGFR (African American): 58 — ABNORMAL LOW
EGFR (Non-African Amer.): 50 — ABNORMAL LOW
Osmolality: 279 (ref 275–301)
Potassium: 3.9 mmol/L (ref 3.5–5.1)
Sodium: 138 mmol/L (ref 136–145)

## 2012-03-24 LAB — BASIC METABOLIC PANEL
Anion Gap: 8 (ref 7–16)
Chloride: 108 mmol/L — ABNORMAL HIGH (ref 98–107)
Creatinine: 1.15 mg/dL (ref 0.60–1.30)
EGFR (African American): >60
EGFR (Non-African Amer.): 59 — ABNORMAL LOW
Glucose: 123 mg/dL — ABNORMAL HIGH (ref 65–99)
Osmolality: 284 (ref 275–301)
Sodium: 141 mmol/L (ref 136–145)

## 2012-03-24 LAB — CBC WITH DIFFERENTIAL/PLATELET
HGB: 9.9 g/dL — ABNORMAL LOW (ref 13.0–18.0)
MCH: 33.9 pg (ref 26.0–34.0)
MCHC: 33.2 g/dL (ref 32.0–36.0)
RBC: 2.93 10*6/uL — ABNORMAL LOW (ref 4.40–5.90)
Segmented Neutrophils: 24 %
Variant Lymphocyte - H1-Rlymph: 5 %
WBC: 46.9 10*3/uL — ABNORMAL HIGH (ref 3.8–10.6)

## 2012-03-24 LAB — PROTIME-INR
INR: 2.9
Prothrombin Time: 30.4 secs — ABNORMAL HIGH (ref 11.5–14.7)

## 2012-03-25 LAB — CBC WITH DIFFERENTIAL/PLATELET
HCT: 28 % — ABNORMAL LOW (ref 40.0–52.0)
HGB: 9.5 g/dL — ABNORMAL LOW (ref 13.0–18.0)
Lymphocytes: 76 %
MCHC: 33.8 g/dL (ref 32.0–36.0)
MCV: 102 fL — ABNORMAL HIGH (ref 80–100)
Monocytes: 3 %
RDW: 13.4 % (ref 11.5–14.5)
Variant Lymphocyte - H1-Rlymph: 3 %

## 2012-03-25 LAB — BASIC METABOLIC PANEL
Anion Gap: 6 — ABNORMAL LOW (ref 7–16)
BUN: 21 mg/dL — ABNORMAL HIGH (ref 7–18)
Calcium, Total: 7.9 mg/dL — ABNORMAL LOW (ref 8.5–10.1)
Chloride: 109 mmol/L — ABNORMAL HIGH (ref 98–107)
Co2: 26 mmol/L (ref 21–32)
Creatinine: 1.17 mg/dL (ref 0.60–1.30)
EGFR (Non-African Amer.): 58 — ABNORMAL LOW
Glucose: 117 mg/dL — ABNORMAL HIGH (ref 65–99)
Potassium: 3.9 mmol/L (ref 3.5–5.1)
Sodium: 141 mmol/L (ref 136–145)

## 2012-03-25 LAB — PROTIME-INR: Prothrombin Time: 26.6 secs — ABNORMAL HIGH (ref 11.5–14.7)

## 2012-03-26 LAB — PROTIME-INR: Prothrombin Time: 27.7 secs — ABNORMAL HIGH (ref 11.5–14.7)

## 2012-03-27 LAB — BASIC METABOLIC PANEL
Anion Gap: 5 — ABNORMAL LOW (ref 7–16)
BUN: 19 mg/dL — ABNORMAL HIGH (ref 7–18)
Chloride: 110 mmol/L — ABNORMAL HIGH (ref 98–107)
Creatinine: 1.08 mg/dL (ref 0.60–1.30)
EGFR (African American): >60
EGFR (Non-African Amer.): >60
Glucose: 112 mg/dL — ABNORMAL HIGH (ref 65–99)
Osmolality: 290 (ref 275–301)
Potassium: 3.5 mmol/L (ref 3.5–5.1)

## 2012-03-27 LAB — CBC WITH DIFFERENTIAL/PLATELET
Comment - H1-Com3: NORMAL
HCT: 30.5 % — ABNORMAL LOW (ref 40.0–52.0)
HGB: 10 g/dL — ABNORMAL LOW (ref 13.0–18.0)
MCH: 33.7 pg (ref 26.0–34.0)
MCHC: 32.9 g/dL (ref 32.0–36.0)
MCV: 103 fL — ABNORMAL HIGH (ref 80–100)
Monocytes: 2 %
RBC: 2.97 10*6/uL — ABNORMAL LOW (ref 4.40–5.90)

## 2012-03-27 LAB — PROTIME-INR: INR: 2.9

## 2012-03-28 LAB — PROTIME-INR
INR: 3.2
Prothrombin Time: 32.4 secs — ABNORMAL HIGH (ref 11.5–14.7)

## 2012-04-20 ENCOUNTER — Ambulatory Visit: Payer: Self-pay | Admitting: Oncology

## 2012-05-24 ENCOUNTER — Inpatient Hospital Stay (HOSPITAL_COMMUNITY)
Admission: EM | Admit: 2012-05-24 | Discharge: 2012-06-20 | DRG: 208 | Disposition: E | Payer: Medicare Other | Attending: Internal Medicine | Admitting: Internal Medicine

## 2012-05-24 ENCOUNTER — Emergency Department (HOSPITAL_COMMUNITY): Payer: Medicare Other

## 2012-05-24 ENCOUNTER — Other Ambulatory Visit: Payer: Self-pay

## 2012-05-24 ENCOUNTER — Encounter (HOSPITAL_COMMUNITY): Payer: Self-pay | Admitting: *Deleted

## 2012-05-24 DIAGNOSIS — R6520 Severe sepsis without septic shock: Secondary | ICD-10-CM

## 2012-05-24 DIAGNOSIS — T45515A Adverse effect of anticoagulants, initial encounter: Secondary | ICD-10-CM | POA: Diagnosis present

## 2012-05-24 DIAGNOSIS — J9600 Acute respiratory failure, unspecified whether with hypoxia or hypercapnia: Principal | ICD-10-CM | POA: Diagnosis present

## 2012-05-24 DIAGNOSIS — Z8679 Personal history of other diseases of the circulatory system: Secondary | ICD-10-CM

## 2012-05-24 DIAGNOSIS — I82509 Chronic embolism and thrombosis of unspecified deep veins of unspecified lower extremity: Secondary | ICD-10-CM | POA: Diagnosis present

## 2012-05-24 DIAGNOSIS — R6521 Severe sepsis with septic shock: Secondary | ICD-10-CM | POA: Diagnosis present

## 2012-05-24 DIAGNOSIS — E46 Unspecified protein-calorie malnutrition: Secondary | ICD-10-CM | POA: Diagnosis present

## 2012-05-24 DIAGNOSIS — A419 Sepsis, unspecified organism: Secondary | ICD-10-CM | POA: Diagnosis present

## 2012-05-24 DIAGNOSIS — I2782 Chronic pulmonary embolism: Secondary | ICD-10-CM | POA: Diagnosis present

## 2012-05-24 DIAGNOSIS — I4891 Unspecified atrial fibrillation: Secondary | ICD-10-CM | POA: Diagnosis present

## 2012-05-24 DIAGNOSIS — I82409 Acute embolism and thrombosis of unspecified deep veins of unspecified lower extremity: Secondary | ICD-10-CM | POA: Diagnosis present

## 2012-05-24 DIAGNOSIS — Z86718 Personal history of other venous thrombosis and embolism: Secondary | ICD-10-CM

## 2012-05-24 DIAGNOSIS — Z515 Encounter for palliative care: Secondary | ICD-10-CM

## 2012-05-24 DIAGNOSIS — D696 Thrombocytopenia, unspecified: Secondary | ICD-10-CM | POA: Diagnosis present

## 2012-05-24 DIAGNOSIS — F0390 Unspecified dementia without behavioral disturbance: Secondary | ICD-10-CM | POA: Diagnosis present

## 2012-05-24 DIAGNOSIS — Z7901 Long term (current) use of anticoagulants: Secondary | ICD-10-CM

## 2012-05-24 DIAGNOSIS — Z86711 Personal history of pulmonary embolism: Secondary | ICD-10-CM

## 2012-05-24 DIAGNOSIS — E870 Hyperosmolality and hypernatremia: Secondary | ICD-10-CM | POA: Diagnosis present

## 2012-05-24 DIAGNOSIS — C9110 Chronic lymphocytic leukemia of B-cell type not having achieved remission: Secondary | ICD-10-CM | POA: Diagnosis present

## 2012-05-24 DIAGNOSIS — I2510 Atherosclerotic heart disease of native coronary artery without angina pectoris: Secondary | ICD-10-CM | POA: Diagnosis present

## 2012-05-24 DIAGNOSIS — D539 Nutritional anemia, unspecified: Secondary | ICD-10-CM | POA: Diagnosis present

## 2012-05-24 DIAGNOSIS — J690 Pneumonitis due to inhalation of food and vomit: Secondary | ICD-10-CM | POA: Diagnosis present

## 2012-05-24 DIAGNOSIS — Z87898 Personal history of other specified conditions: Secondary | ICD-10-CM

## 2012-05-24 DIAGNOSIS — I10 Essential (primary) hypertension: Secondary | ICD-10-CM | POA: Diagnosis present

## 2012-05-24 DIAGNOSIS — R791 Abnormal coagulation profile: Secondary | ICD-10-CM | POA: Diagnosis present

## 2012-05-24 DIAGNOSIS — G9340 Encephalopathy, unspecified: Secondary | ICD-10-CM | POA: Diagnosis present

## 2012-05-24 DIAGNOSIS — E785 Hyperlipidemia, unspecified: Secondary | ICD-10-CM | POA: Diagnosis present

## 2012-05-24 DIAGNOSIS — G9341 Metabolic encephalopathy: Secondary | ICD-10-CM | POA: Diagnosis present

## 2012-05-24 DIAGNOSIS — E876 Hypokalemia: Secondary | ICD-10-CM | POA: Diagnosis present

## 2012-05-24 DIAGNOSIS — Z66 Do not resuscitate: Secondary | ICD-10-CM | POA: Diagnosis present

## 2012-05-24 DIAGNOSIS — J189 Pneumonia, unspecified organism: Secondary | ICD-10-CM | POA: Diagnosis present

## 2012-05-24 LAB — CBC WITH DIFFERENTIAL/PLATELET
Basophils Absolute: 0 10*3/uL (ref 0.0–0.1)
Eosinophils Relative: 0 % (ref 0–5)
Lymphocytes Relative: 65 % — ABNORMAL HIGH (ref 12–46)
MCV: 100.8 fL — ABNORMAL HIGH (ref 78.0–100.0)
Monocytes Relative: 5 % (ref 3–12)
Platelets: 61 10*3/uL — ABNORMAL LOW (ref 150–400)
RBC: 3.65 MIL/uL — ABNORMAL LOW (ref 4.22–5.81)
RDW: 15.5 % (ref 11.5–15.5)
WBC: 20.7 10*3/uL — ABNORMAL HIGH (ref 4.0–10.5)

## 2012-05-24 LAB — BLOOD GAS, ARTERIAL
Acid-base deficit: 3.2 mmol/L — ABNORMAL HIGH (ref 0.0–2.0)
Drawn by: 11249
FIO2: 0.6 %
MECHVT: 600 mL
Patient temperature: 98.6
RATE: 14 resp/min
TCO2: 19.9 mmol/L (ref 0–100)
pH, Arterial: 7.366 (ref 7.350–7.450)

## 2012-05-24 LAB — LACTIC ACID, PLASMA
Lactic Acid, Venous: 5.8 mmol/L — ABNORMAL HIGH (ref 0.5–2.2)
Lactic Acid, Venous: 4.1 mmol/L — ABNORMAL HIGH (ref 0.5–2.2)

## 2012-05-24 LAB — COMPREHENSIVE METABOLIC PANEL
ALT: 17 U/L (ref 0–53)
AST: 15 U/L (ref 0–37)
Albumin: 2 g/dL — ABNORMAL LOW (ref 3.5–5.2)
BUN: 23 mg/dL (ref 6–23)
CO2: 27 mEq/L (ref 19–32)
Calcium: 8.8 mg/dL (ref 8.4–10.5)
Calcium: 8.2 mg/dL — ABNORMAL LOW (ref 8.4–10.5)
Creatinine, Ser: 1.06 mg/dL (ref 0.50–1.35)
Creatinine, Ser: 1.17 mg/dL (ref 0.50–1.35)
GFR calc Af Amer: 73 mL/min — ABNORMAL LOW (ref >90)
GFR calc non Af Amer: 63 mL/min — ABNORMAL LOW (ref >90)
Glucose, Bld: 122 mg/dL — ABNORMAL HIGH (ref 70–99)
Total Protein: 4.8 g/dL — ABNORMAL LOW (ref 6.0–8.3)
Total Protein: 4.5 g/dL — ABNORMAL LOW (ref 6.0–8.3)

## 2012-05-24 LAB — APTT: aPTT: 83 seconds — ABNORMAL HIGH (ref 24–37)

## 2012-05-24 LAB — OCCULT BLOOD, POC DEVICE: Fecal Occult Bld: POSITIVE

## 2012-05-24 LAB — URINALYSIS, ROUTINE W REFLEX MICROSCOPIC
Hgb urine dipstick: NEGATIVE
Nitrite: NEGATIVE
Protein, ur: NEGATIVE mg/dL
Urobilinogen, UA: 0.2 mg/dL (ref 0.0–1.0)

## 2012-05-24 LAB — URINE MICROSCOPIC-ADD ON

## 2012-05-24 LAB — TROPONIN I: Troponin I: <0.3 ng/mL (ref <0.30)

## 2012-05-24 LAB — MRSA PCR SCREENING: MRSA by PCR: POSITIVE — AB

## 2012-05-24 LAB — CARBOXYHEMOGLOBIN
Carboxyhemoglobin: 1.1 % (ref 0.5–1.5)
Methemoglobin: 0.7 % (ref 0.0–1.5)
O2 Saturation: 85.1 %
Total hemoglobin: 10.8 g/dL — ABNORMAL LOW (ref 13.5–18.0)

## 2012-05-24 LAB — PROTIME-INR
INR: 2.64 — ABNORMAL HIGH (ref 0.00–1.49)
INR: 3.01 — ABNORMAL HIGH (ref 0.00–1.49)
Prothrombin Time: 28.6 seconds — ABNORMAL HIGH (ref 11.6–15.2)

## 2012-05-24 LAB — PRO B NATRIURETIC PEPTIDE: Pro B Natriuretic peptide (BNP): 1248 pg/mL — ABNORMAL HIGH (ref 0–450)

## 2012-05-24 LAB — GLUCOSE, CAPILLARY: Glucose-Capillary: 175 mg/dL — ABNORMAL HIGH (ref 70–99)

## 2012-05-24 LAB — PROCALCITONIN: Procalcitonin: 0.12 ng/mL

## 2012-05-24 LAB — TYPE AND SCREEN

## 2012-05-24 LAB — POCT I-STAT TROPONIN I: Troponin i, poc: 0 ng/mL (ref 0.00–0.08)

## 2012-05-24 MED ORDER — PIPERACILLIN-TAZOBACTAM 3.375 G IVPB
3.3750 g | Freq: Once | INTRAVENOUS | Status: AC
  Administered 2012-05-24: 3.375 g via INTRAVENOUS
  Filled 2012-05-24: qty 50

## 2012-05-24 MED ORDER — DEXTROSE 10 % IV SOLN: INTRAVENOUS | Status: DC | PRN

## 2012-05-24 MED ORDER — SODIUM CHLORIDE 0.9 % IV SOLN
750.0000 mL | INTRAVENOUS | Status: DC | PRN
  Administered 2012-05-24: 750 mL via INTRAVENOUS
  Administered 2012-05-24: 1000 mL via INTRAVENOUS
  Administered 2012-05-24 – 2012-05-25 (×3): 750 mL via INTRAVENOUS

## 2012-05-24 MED ORDER — FENTANYL BOLUS VIA INFUSION
50.0000 ug | Freq: Four times a day (QID) | INTRAVENOUS | Status: DC | PRN
  Filled 2012-05-24: qty 100

## 2012-05-24 MED ORDER — METHYLPREDNISOLONE SODIUM SUCC 125 MG IJ SOLR
INTRAMUSCULAR | Status: AC
  Administered 2012-05-24: 02:00:00
  Filled 2012-05-24: qty 2

## 2012-05-24 MED ORDER — CHLORHEXIDINE GLUCONATE CLOTH 2 % EX PADS
6.0000 | MEDICATED_PAD | Freq: Every day | CUTANEOUS | Status: AC
  Administered 2012-05-24 – 2012-05-29 (×5): 6 via TOPICAL

## 2012-05-24 MED ORDER — NOREPINEPHRINE BITARTRATE 1 MG/ML IJ SOLN
2.0000 ug/min | INTRAVENOUS | Status: DC
  Administered 2012-05-24: 8 ug/min via INTRAVENOUS
  Administered 2012-05-24 (×2): 30 ug/min via INTRAVENOUS
  Administered 2012-05-24: 2 ug/min via INTRAVENOUS
  Administered 2012-05-25: 8 ug/min via INTRAVENOUS
  Filled 2012-05-24 (×7): qty 4

## 2012-05-24 MED ORDER — SODIUM CHLORIDE 0.9 % IV SOLN
INTRAVENOUS | Status: DC
  Administered 2012-05-24: 04:00:00 via INTRAVENOUS

## 2012-05-24 MED ORDER — SIMVASTATIN 20 MG PO TABS
20.0000 mg | ORAL_TABLET | Freq: Every day | ORAL | Status: DC
Start: 2012-05-24 — End: 2012-05-24
  Filled 2012-05-24: qty 1

## 2012-05-24 MED ORDER — ASPIRIN 81 MG PO TBEC: 81.0000 mg | DELAYED_RELEASE_TABLET | Freq: Every day | ORAL | Status: DC

## 2012-05-24 MED ORDER — IPRATROPIUM-ALBUTEROL 18-103 MCG/ACT IN AERO
6.0000 | INHALATION_SPRAY | RESPIRATORY_TRACT | Status: DC
  Administered 2012-05-24: 6 via RESPIRATORY_TRACT
  Filled 2012-05-24 (×2): qty 14.7

## 2012-05-24 MED ORDER — SIMVASTATIN 20 MG PO TABS
20.0000 mg | ORAL_TABLET | Freq: Every day | ORAL | Status: DC
  Administered 2012-05-24: 20 mg
  Filled 2012-05-24 (×2): qty 1

## 2012-05-24 MED ORDER — PANTOPRAZOLE SODIUM 40 MG IV SOLR
40.0000 mg | INTRAVENOUS | Status: DC
  Administered 2012-05-24: 40 mg via INTRAVENOUS
  Filled 2012-05-24: qty 40

## 2012-05-24 MED ORDER — BIOTENE DRY MOUTH MT LIQD
1.0000 "application " | Freq: Four times a day (QID) | OROMUCOSAL | Status: DC
  Administered 2012-05-24 – 2012-06-03 (×39): 15 mL via OROMUCOSAL

## 2012-05-24 MED ORDER — SODIUM CHLORIDE 0.9 % IV SOLN
INTRAVENOUS | Status: DC
  Administered 2012-05-24: 1000 mL via INTRAVENOUS
  Administered 2012-05-25 – 2012-05-27 (×5): via INTRAVENOUS

## 2012-05-24 MED ORDER — INSULIN ASPART 100 UNIT/ML ~~LOC~~ SOLN
0.0000 [IU] | SUBCUTANEOUS | Status: DC
  Administered 2012-05-24 (×2): 3 [IU] via SUBCUTANEOUS
  Administered 2012-05-25 – 2012-05-31 (×8): 2 [IU] via SUBCUTANEOUS
  Administered 2012-05-31: 3 [IU] via SUBCUTANEOUS
  Administered 2012-05-31: 2 [IU] via SUBCUTANEOUS

## 2012-05-24 MED ORDER — PIPERACILLIN-TAZOBACTAM 3.375 G IVPB
3.3750 g | Freq: Three times a day (TID) | INTRAVENOUS | Status: DC
  Administered 2012-05-24 – 2012-05-31 (×22): 3.375 g via INTRAVENOUS
  Filled 2012-05-24 (×25): qty 50

## 2012-05-24 MED ORDER — PANTOPRAZOLE SODIUM 40 MG PO PACK
40.0000 mg | PACK | ORAL | Status: DC
  Administered 2012-05-24 – 2012-05-31 (×8): 40 mg
  Filled 2012-05-24 (×9): qty 20

## 2012-05-24 MED ORDER — DEXTROSE-NACL 5-0.45 % IV SOLN: INTRAVENOUS | Status: DC

## 2012-05-24 MED ORDER — VASOPRESSIN 20 UNIT/ML IJ SOLN
0.0300 [IU]/min | INTRAVENOUS | Status: DC
  Administered 2012-05-24: 0.03 [IU]/min via INTRAVENOUS
  Filled 2012-05-24: qty 2.5

## 2012-05-24 MED ORDER — ATROPINE SULFATE 0.1 MG/ML IJ SOLN
INTRAMUSCULAR | Status: AC
  Administered 2012-05-24: 1 mg
  Filled 2012-05-24: qty 10

## 2012-05-24 MED ORDER — HYDROCORTISONE SOD SUCCINATE 100 MG IJ SOLR
50.0000 mg | Freq: Four times a day (QID) | INTRAMUSCULAR | Status: DC
  Administered 2012-05-24 – 2012-05-26 (×8): 50 mg via INTRAVENOUS
  Filled 2012-05-24: qty 1
  Filled 2012-05-24: qty 2
  Filled 2012-05-24 (×8): qty 1

## 2012-05-24 MED ORDER — NOREPINEPHRINE BITARTRATE 1 MG/ML IJ SOLN
2.0000 ug/min | INTRAVENOUS | Status: DC
  Filled 2012-05-24: qty 4

## 2012-05-24 MED ORDER — CHLORHEXIDINE GLUCONATE 0.12 % MT SOLN
15.0000 mL | Freq: Two times a day (BID) | OROMUCOSAL | Status: DC
  Administered 2012-05-24 – 2012-06-03 (×23): 15 mL via OROMUCOSAL
  Filled 2012-05-24 (×23): qty 15

## 2012-05-24 MED ORDER — SODIUM CHLORIDE 0.9 % IV SOLN: 750.0000 mL | Freq: Once | INTRAVENOUS | Status: DC

## 2012-05-24 MED ORDER — MIDAZOLAM BOLUS VIA INFUSION
1.0000 mg | INTRAVENOUS | Status: DC | PRN
  Administered 2012-05-24: 2 mg via INTRAVENOUS
  Filled 2012-05-24: qty 2

## 2012-05-24 MED ORDER — SODIUM CHLORIDE 0.9 % IV SOLN
2.0000 mg/h | INTRAVENOUS | Status: DC
  Administered 2012-05-24: 2 mg/h via INTRAVENOUS
  Filled 2012-05-24 (×2): qty 10

## 2012-05-24 MED ORDER — ALBUTEROL SULFATE (5 MG/ML) 0.5% IN NEBU
INHALATION_SOLUTION | RESPIRATORY_TRACT | Status: AC
  Administered 2012-05-24: 02:00:00
  Filled 2012-05-24: qty 1

## 2012-05-24 MED ORDER — SUCCINYLCHOLINE CHLORIDE 20 MG/ML IJ SOLN
120.0000 mg | Freq: Once | INTRAMUSCULAR | Status: AC
  Administered 2012-05-24: 120 mg via INTRAVENOUS

## 2012-05-24 MED ORDER — SODIUM CHLORIDE 0.9 % IV SOLN
750.0000 mg | Freq: Two times a day (BID) | INTRAVENOUS | Status: DC
  Administered 2012-05-24 – 2012-05-31 (×14): 750 mg via INTRAVENOUS
  Filled 2012-05-24 (×17): qty 750

## 2012-05-24 MED ORDER — SODIUM CHLORIDE 0.9 % IV BOLUS (SEPSIS)
1000.0000 mL | Freq: Once | INTRAVENOUS | Status: AC
  Administered 2012-05-24: 1000 mL via INTRAVENOUS

## 2012-05-24 MED ORDER — INSULIN ASPART 100 UNIT/ML ~~LOC~~ SOLN: 0.0000 [IU] | SUBCUTANEOUS | Status: DC

## 2012-05-24 MED ORDER — FENTANYL CITRATE 0.05 MG/ML IJ SOLN
25.0000 ug | INTRAMUSCULAR | Status: DC | PRN
Start: 2012-05-24 — End: 2012-06-03
  Administered 2012-05-24 – 2012-05-25 (×3): 50 ug via INTRAVENOUS
  Administered 2012-05-25: 25 ug via INTRAVENOUS
  Administered 2012-05-25 – 2012-06-01 (×7): 50 ug via INTRAVENOUS
  Filled 2012-05-24 (×11): qty 2

## 2012-05-24 MED ORDER — IPRATROPIUM BROMIDE 0.02 % IN SOLN
RESPIRATORY_TRACT | Status: AC
  Administered 2012-05-24: 02:00:00
  Filled 2012-05-24: qty 2.5

## 2012-05-24 MED ORDER — IPRATROPIUM-ALBUTEROL 18-103 MCG/ACT IN AERO
6.0000 | INHALATION_SPRAY | RESPIRATORY_TRACT | Status: DC | PRN
  Administered 2012-05-24: 6 via RESPIRATORY_TRACT

## 2012-05-24 MED ORDER — DONEPEZIL HCL 10 MG PO TABS
10.0000 mg | ORAL_TABLET | Freq: Every day | ORAL | Status: DC
  Administered 2012-05-24 – 2012-05-31 (×8): 10 mg via ORAL
  Filled 2012-05-24 (×8): qty 1

## 2012-05-24 MED ORDER — MIDAZOLAM HCL 5 MG/ML IJ SOLN
1.0000 mg | INTRAMUSCULAR | Status: DC | PRN
  Administered 2012-05-24 – 2012-05-25 (×3): 2 mg via INTRAVENOUS
  Administered 2012-05-25 (×2): 1 mg via INTRAVENOUS
  Administered 2012-05-25: 2 mg via INTRAVENOUS
  Administered 2012-05-25: 1 mg via INTRAVENOUS
  Administered 2012-05-26: 2 mg via INTRAVENOUS
  Filled 2012-05-24 (×9): qty 1

## 2012-05-24 MED ORDER — VANCOMYCIN HCL IN DEXTROSE 1-5 GM/200ML-% IV SOLN
1000.0000 mg | Freq: Once | INTRAVENOUS | Status: AC
  Administered 2012-05-24: 1000 mg via INTRAVENOUS
  Filled 2012-05-24: qty 200

## 2012-05-24 MED ORDER — MUPIROCIN 2 % EX OINT
1.0000 "application " | TOPICAL_OINTMENT | Freq: Two times a day (BID) | CUTANEOUS | Status: AC
  Administered 2012-05-24 – 2012-05-28 (×10): 1 via NASAL
  Filled 2012-05-24 (×2): qty 22

## 2012-05-24 MED ORDER — ETOMIDATE 2 MG/ML IV SOLN
20.0000 mg | Freq: Once | INTRAVENOUS | Status: AC
  Administered 2012-05-24: 20 mg via INTRAVENOUS

## 2012-05-24 MED ORDER — ASPIRIN EC 81 MG PO TBEC
81.0000 mg | DELAYED_RELEASE_TABLET | Freq: Every day | ORAL | Status: DC
  Administered 2012-05-24 – 2012-05-25 (×2): 81 mg via ORAL
  Filled 2012-05-24 (×3): qty 1

## 2012-05-24 MED ORDER — SODIUM CHLORIDE 0.9 % IV SOLN
50.0000 ug/h | INTRAVENOUS | Status: DC
  Administered 2012-05-24: 50 ug/h via INTRAVENOUS
  Filled 2012-05-24: qty 50

## 2012-05-24 MED ORDER — TAMSULOSIN HCL 0.4 MG PO CAPS
0.4000 mg | ORAL_CAPSULE | Freq: Every day | ORAL | Status: DC
  Administered 2012-05-25: 0.4 mg via ORAL
  Filled 2012-05-24 (×2): qty 1

## 2012-05-24 NOTE — Progress Notes (Signed)
ANTIBIOTIC CONSULT NOTE - INITIAL  Pharmacy Consult for Vancomycin/Zosyn Indication: rule out pneumonia  No Known Allergies  Patient Measurements: Height: 6\' 2"  (188 cm) Weight: 180 lb 1.9 oz (81.7 kg) IBW/kg (Calculated) : 82.2    Vital Signs: Temp: 95.4 F (35.2 C) (09/04 1610) Temp src: Core (Comment) (09/04 0605) BP: 87/52 mmHg (09/04 0635) Pulse Rate: 66  (09/04 0635) Intake/Output from previous day: 09/03 0701 - 09/04 0700 In: 114 [I.V.:114] Out: 135 [Urine:135] Intake/Output from this shift: Total I/O In: 114 [I.V.:114] Out: 135 [Urine:135]  Labs:  Basename 06/09/2012 0200  WBC 20.7*  HGB 12.5*  PLT 61*  LABCREA --  CREATININE 1.06   Estimated Creatinine Clearance: 62.1 ml/min (by C-G formula based on Cr of 1.06). No results found for this basename: VANCOTROUGH:2,VANCOPEAK:2,VANCORANDOM:2,GENTTROUGH:2,GENTPEAK:2,GENTRANDOM:2,TOBRATROUGH:2,TOBRAPEAK:2,TOBRARND:2,AMIKACINPEAK:2,AMIKACINTROU:2,AMIKACIN:2, in the last 72 hours   Microbiology: No results found for this or any previous visit (from the past 720 hour(s)).  Medical History: Past Medical History  Diagnosis Date  . Lymphocytosis 10/06    absolute  . Vertigo     chronic   . Lymphocytic leukemia     chronic   . H/O: GI bleed     secondary to jejunal lesions   . HTN (hypertension)   . Dyslipidemia   . DVT (deep vein thrombosis) in pregnancy     hx  . PE (pulmonary embolism)     hx   . Hx-TIA (transient ischemic attack)   . Migraine     hx     Medications:  Scheduled:    . albuterol      . albuterol-ipratropium  6 puff Inhalation Q4H  . antiseptic oral rinse  1 application Mouth Rinse QID  . aspirin EC  81 mg Oral Daily  . atropine      . chlorhexidine  15 mL Mouth/Throat BID  . donepezil  10 mg Oral Daily  . etomidate  20 mg Intravenous Once  . ipratropium      . methylPREDNISolone sodium succinate      . pantoprazole (PROTONIX) IV  40 mg Intravenous Q24H  .  piperacillin-tazobactam (ZOSYN)  IV  3.375 g Intravenous Once  . simvastatin  20 mg Oral QHS  . succinylcholine  120 mg Intravenous Once  . Tamsulosin HCl  0.4 mg Oral Daily  . vancomycin  1,000 mg Intravenous Once  . DISCONTD: aspirin  81 mg Oral Daily   Infusions:    . sodium chloride 100 mL/hr at 06/19/2012 0353  . fentaNYL infusion INTRAVENOUS 100 mcg/hr (06/19/2012 0330)  . midazolam (VERSED) infusion 4 mg/hr (06/18/2012 0311)  . norepinephrine (LEVOPHED) Adult infusion 15 mcg/min (05/25/2012 0630)   Assessment: 11 male admitted in acute respiratory failure- HCAP.  MD ordering Vancomycin and Zosyn per Rx.  Goal of Therapy:  Vancomycin trough level 15-20 mcg/ml  Plan:   Zosyn 3.375 Gm IV q8h.  EI infusion  Vancomcyin 750mg  IV q12h.  CrCl~55 (N)  F/U SCr/levels as needed.  Ernest Kramer 05/22/2012,6:43 AM

## 2012-05-24 NOTE — Progress Notes (Signed)
Name: Ernest Kramer MRN: [id number] DOB: 1928/08/25    LOS: 0  Referring Provider:  EDP Reason for Referral:  Acute respiratory failure  PULMONARY / CRITICAL CARE MEDICINE  Brief patient description:  78 yo NH resident with dementia, leukemia and recently diagnosed pneumonia brought to [facility] in acute respiratory distress and intubated for acute respiratory failure.  Hypotensive requiring vasopressors.  Events Since Admission: 9/4 Brought to [facility] in acute respiratory distress and intubated for acute respiratory failure.  Hypotensive requiring vasopressors.  Current Status: Still hypotensive   Vital Signs: Temp:  [90.1 F (32.3 C)-96.8 F (36 C)] 96.8 F (36 C) (09/04 0800) Pulse Rate:  [43-75] 67  (09/04 0800) Resp:  [13-14] 14  (09/04 0800) BP: (62-102)/(41-57) 68/43 mmHg (09/04 0800) SpO2:  [91 %-100 %] 99 % (09/04 0901) FiO2 (%):  [50 %-60 %] 50 % (09/04 0902) Weight:  [81.7 kg (180 lb 1.9 oz)] 81.7 kg (180 lb 1.9 oz) (09/04 0500)  Physical Examination: General:  Intubated, synchronous Neuro:  Sedated, nonfocal, cough / gag present, gcs 3  HEENT:  PERRL Neck:  No JVD Cardiovascular:  RRR, no m/r/g Lungs:  Bilateral air entry, clear  Abdomen:  Soft, nontender, nondistended, bowel sounds present Musculoskeletal:  Moves allextremities Skin:  No rash  Active Problems:  LEUKEMIA, LYMPHOCYTIC, CHRONIC  DYSLIPIDEMIA  HYPERTENSION  DVT  TRANSIENT ISCHEMIC ATTACKS, HX OF  PULMONARY EMBOLISM, HX OF  Aspiration pneumonia  Septic shock  Acute respiratory failure  Acute encephalopathy  ASSESSMENT AND PLAN  PULMONARY  Lab 06-14-12 0310  PHART 7.366  PCO2ART 38.0  PO2ART 90.9  HCO3 21.2  O2SAT 96.7   Ventilator Settings: Vent Mode:  [-] PRVC FiO2 (%):  [50 %-60 %] 50 % Set Rate:  [14 bmp] 14 bmp Vt Set:  [600 mL] 600 mL PEEP:  [5 cmH20] 5 cmH20 Plateau Pressure:  [16 cmH20-17 cmH20] 16 cmH20  CXR:  9/4  >>> Hardware in good position.  Bilateral airspace  disease. No sig change in bilateral airspace disease L>R.   ETT:  9/4 >>>  A:  Acute respiratory failure.  HCAP vs asp P:   Goal SpO2 > 92, pH . 7.30 Full mechanical support Daily SBT Combivent as needed F/u CXR  CARDIOVASCULAR  Lab June 14, 2012 0405 Jun 14, 2012 0200  TROPONINI <0.30 --  LATICACIDVEN -- 2.1  PROBNP -- 1248.0*   ECG:  9/4  Atrial fibrillation, controlled rate Lines: R IJ CVL 9/4 >>>  A: Septic shock 2nd to PNA.  Hx of Atrial fibrillation / controlled rate, CAD, Dyslipidemia, PE/DVT on anticoagulation. P:  Goal cvp 8-12 Goal MAP > 65 Levophed gtt./ add vasopressin if needed ASA, Lipitor Check cortisol  RENAL  Lab 14-Jun-2012 0200  NA 147*  K 3.9  CL 110  CO2 27  BUN 23  CREATININE 1.06  CALCIUM 8.8  MG --  PHOS --   Intake/Output      09/03 0701 - 09/04 0700 09/04 0701 - 09/05 0700   I.V. (mL/kg) 360.3 (4.4)    Total Intake(mL/kg) 360.3 (4.4)    Urine (mL/kg/hr) 140 (0.1)    Total Output 140    Net +220.3         Urine Occurrence 1 x     Foley:  9/4 >>>  A: Hypernatremia. P:   Trend BMP Hold HCTZ  GASTROINTESTINAL  Lab 14-Jun-2012 0200  AST 18  ALT 17  ALKPHOS 187*  BILITOT 0.9  PROT 4.8*  ALBUMIN 2.3*   A:  No acute issues.  Suspected malnutrition. P:   Ask nutrition to recommend/start tube feeds   HEMATOLOGIC  Lab 06/07/2012 0200  HGB 12.5*  HCT 36.8*  PLT 61*  INR 2.64*  APTT 83*   A:  Coagulopathy (Coumadin induced).  Thrombocytopenia.  Hx of chronic lymphocytic leukemia. No evidence of bleeding. P:  Trend CBC Hold coumadin while critically ill>>would use heparin gtt when INR < 2 and then resume coumadin when more stable  INFECTIOUS  Lab 06/11/2012 0200  WBC 20.7*  PROCALCITON 0.11   Cultures: 9/4  Blood >> 9/4  Respiratory >>>  Antibiotics: Zosyn 9/4 >>> Vancomycin 9/4 >>>  A:  Suspected HCAP vs aspiration. P:   Antibiotics / cultures as above Trend PCT   ENDOCRINE  Lab 06/12/2012 0800 06/09/2012 0302    GLUCAP 144* 111*   A:  Hyperglycemia  P:   ssi   NEUROLOGIC  A:  Acute encephalopathy.  Baseline dementia. Sedated. Sedating gtts stopped  P:   Goal RASS 0 to -1 Daily WUA Aricept Transition to bolus sedation  BEST PRACTICE / DISPOSITION Level of Care:  ICU Primary Service:  PCCM Consultants:  None Code Status:  Partial (already intubated, medications OK, no CPR / cardioversion) Diet:  Tube feeds DVT Px: chronic anti-coaguation with coumadin GI Px: protonix Skin Integrity:  Intact Social / Family:  Daughter (medical POA) updated at bedside.  Wants short trial of ICU care but is realistic and will discontinue life support if no improvement in 24-48 hours.  Critical care time 35 minutes.  Reviewed above, examined pt, and agree with assessment/plan.  Coralyn Helling, MD [facility] Pulmonary/Critical Care 05/21/2012, 10:54 AM Pager:  [phone] After 3pm call: [phone]

## 2012-05-24 NOTE — Progress Notes (Signed)
INITIAL ADULT NUTRITION ASSESSMENT Date:    Time: 11:03 AM Reason for Assessment: Vent, Consult for nutrition assessment and recommendations  ASSESSMENT: Male 78 y.o.  Dx: acute encephalopathy, suspected HCAP vs aspiration, intubated, stage 2 decub  Hx:  Past Medical History  Diagnosis Date  . Lymphocytosis 10/06    absolute  . Vertigo     chronic   . Lymphocytic leukemia     chronic   . H/O: GI bleed     secondary to jejunal lesions   . HTN (hypertension)   . Dyslipidemia   . DVT (deep vein thrombosis) in pregnancy     hx  . PE (pulmonary embolism)     hx   . Hx-TIA (transient ischemic attack)   . Migraine     hx    Past Medical History  Diagnosis Date  . Lymphocytosis 10/06    absolute  . Vertigo     chronic   . Lymphocytic leukemia     chronic   . H/O: GI bleed     secondary to jejunal lesions   . HTN (hypertension)   . Dyslipidemia   . DVT (deep vein thrombosis) in pregnancy     hx  . PE (pulmonary embolism)     hx   . Hx-TIA (transient ischemic attack)   . Migraine     hx      Related Meds:     . albuterol      . antiseptic oral rinse  1 application Mouth Rinse QID  . aspirin EC  81 mg Oral Daily  . atropine      . chlorhexidine  15 mL Mouth/Throat BID  . Chlorhexidine Gluconate Cloth  6 each Topical Q0600  . donepezil  10 mg Oral Daily  . etomidate  20 mg Intravenous Once  . insulin aspart  0-15 Units Subcutaneous Q4H  . ipratropium      . methylPREDNISolone sodium succinate      . mupirocin ointment  1 application Nasal BID  . pantoprazole sodium  40 mg Per Tube Q24H  . piperacillin-tazobactam (ZOSYN)  IV  3.375 g Intravenous Once  . piperacillin-tazobactam (ZOSYN)  IV  3.375 g Intravenous Q8H  . simvastatin  20 mg Per Tube QHS  . sodium chloride  1,000 mL Intravenous Once  . succinylcholine  120 mg Intravenous Once  . Tamsulosin HCl  0.4 mg Oral Daily  . vancomycin  750 mg Intravenous Q12H  . vancomycin  1,000 mg Intravenous  Once  . DISCONTD: albuterol-ipratropium  6 puff Inhalation Q4H  . DISCONTD: aspirin  81 mg Oral Daily  . DISCONTD: insulin aspart  0-4 Units Subcutaneous Q4H  . DISCONTD: pantoprazole (PROTONIX) IV  40 mg Intravenous Q24H  . DISCONTD: simvastatin  20 mg Oral QHS       Ht: 6\' 2"  (188 cm)  Wt: 180 lb 1.9 oz (81.7 kg)  Ideal Wt: 86.4kg % Ideal Wt: 95  Usual Wt:  Wt Readings from Last 10 Encounters:  06/11/2012 180 lb 1.9 oz (81.7 kg)  11/03/10 217 lb (98.431 kg)  04/01/10 219 lb (99.338 kg)   % Usual Wt: 83  Body mass index is 23.13 kg/(m^2).   Labs:  CMP     Component Value Date/Time   NA 147* 05/31/2012 0200   NA 147* 12/31/2008 1055   K 3.9 06/09/2012 0200   K 3.5 12/31/2008 1055   CL 110 05/27/2012 0200   CL 102 12/31/2008 1055   CO2 27  05/25/2012 0200   CO2 29 12/31/2008 1055   GLUCOSE 122* 05/30/2012 0200   GLUCOSE 99 12/31/2008 1055   BUN 23 06/18/2012 0200   BUN 15 12/31/2008 1055   CREATININE 1.06 05/29/2012 0200   CREATININE 1.4* 12/31/2008 1055   CALCIUM 8.8 06/02/2012 0200   CALCIUM 8.8 12/31/2008 1055   PROT 4.8* 05/23/2012 0200   PROT 6.2* 12/31/2008 1055   ALBUMIN 2.3* 05/27/2012 0200   AST 18 05/23/2012 0200   AST 35 12/31/2008 1055   ALT 17 06/15/2012 0200   ALKPHOS 187* 05/23/2012 0200   ALKPHOS 71 12/31/2008 1055   BILITOT 0.9 06/08/2012 0200   BILITOT 2.00* 12/31/2008 1055   GFRNONAA 63* 05/28/2012 0200   GFRAA 73* 06/16/2012 0200    I/O last 3 completed shifts: In: 360.3 [I.V.:360.3] Out: 140 [Urine:140] Total I/O In: -  Out: 30 [Urine:30]  Diet Order:  npo  Supplements/Tube Feeding: none  IVF:    sodium chloride   dextrose   norepinephrine (LEVOPHED) Adult infusion Last Rate: 30 mcg/min (06/19/2012 0858)  DISCONTD: sodium chloride Last Rate: Stopped (06/02/2012 1010)  DISCONTD: dextrose 5 % and 0.45% NaCl   DISCONTD: fentaNYL infusion INTRAVENOUS Last Rate: Stopped (05/23/2012 1610)  DISCONTD: midazolam (VERSED) infusion Last Rate: Stopped (06/12/2012 9604)  DISCONTD:  vasopressin (PITRESSIN) infusion - *FOR SHOCK* Last Rate: 0.03 Units/min (05/23/2012 1015)    Estimated Nutritional Needs:   Kcal: 1719 Protein: 100-115 gm Fluid: >1.8L Pt intubated.  Unable to obtain diet hx. Pt with 18% weight loss in the past 1 1/2 year.  Pt at risk for malnutrition.  NUTRITION DIAGNOSIS: -Inadequate oral intake (NI-2.1).  Status: Ongoing  RELATED TO: inability to eat  AS EVIDENCE BY: npo status  MONITORING/EVALUATION(Goals): Goal:  TF to meet 100% estimated needs Monitor:  TF tolerance and adequacy, labs, meds, i/o, weight  EDUCATION NEEDS: -No education needs identified at this time  INTERVENTION: If unable to extubate, rec TF with Oxepa at 20 ml/hr.  Increase 10 ml every 4 hours to goal of 40 ml per hour.  Once tolerated at goal, add prostat 30 ml tid.  Goal:  Oxepa at 40 ml/hr plus 30 ml prostat tid would provide:  1740 kcal, 105 gm protein, 758 ml free water.    Dietitian [phone]  DOCUMENTATION CODES Per approved criteria  -Not Applicable    Ernest Kramer 05/23/2012, 11:03 AM

## 2012-05-24 NOTE — H&P (Signed)
Name: Ernest Kramer MRN: [id number] DOB: 09-14-1928    LOS: 0  Referring Provider:  EDP Reason for Referral:  Acute respiratory failure  PULMONARY / CRITICAL CARE MEDICINE  The patient is encephalopathic and unable to provide history, which was obtained for available medical records.  HPI:  78 yo NH resident with dementia, leukemia and recently diagnosed pneumonia brought to [facility] in acute respiratory distress and intubated for acute respiratory failure.  Hypotensive requiring vasopressors.  Past Medical History  Diagnosis Date  . Lymphocytosis 10/06    absolute  . Vertigo     chronic   . Lymphocytic leukemia     chronic   . H/O: GI bleed     secondary to jejunal lesions   . HTN (hypertension)   . Dyslipidemia   . DVT (deep vein thrombosis) in pregnancy     hx  . PE (pulmonary embolism)     hx   . Hx-TIA (transient ischemic attack)   . Migraine     hx    Past Surgical History  Procedure Date  . Carotid doppler 01/18/01    nl; echo - mild MR, TR. EF wnl 01/18/01, lens implants, bil - 07/08/05, MRI with MRA - L ; frontal CVA 01/18/01  . Appendectomy   . R inguinal hernia repair    Prior to Admission medications   Medication Sig Start Date End Date Taking? Authorizing Provider  aspirin 81 MG EC tablet Take 81 mg by mouth daily.      Historical Provider, MD  donepezil (ARICEPT) 10 MG tablet Take 10 mg by mouth daily.      Historical Provider, MD  hydrochlorothiazide 25 MG tablet Take 25 mg by mouth daily.      Historical Provider, MD  meclizine (ANTIVERT) 25 MG tablet Take 25 mg by mouth daily.      Historical Provider, MD  Multiple Vitamin (MULTIVITAMIN) tablet Take 1 tablet by mouth daily.      Historical Provider, MD  pantoprazole (PROTONIX) 40 MG tablet Take 40 mg by mouth daily.      Historical Provider, MD  simvastatin (ZOCOR) 20 MG tablet Take 20 mg by mouth at bedtime.      Historical Provider, MD  Tamsulosin HCl (FLOMAX) 0.4 MG CAPS Take 0.4 mg by mouth daily.       Historical Provider, MD  warfarin (COUMADIN) 2 MG tablet Take 2 mg by mouth as directed.      Historical Provider, MD   Allergies No Known Allergies  Family History Family History  Problem Relation Age of Onset  . Coronary artery disease Neg Hx     premature CA    Social History  has an unknown smoking status. He does not have any smokeless tobacco history on file. He reports that he does not drink alcohol or use illicit drugs.  Review Of Systems:  Unable to provide  Brief patient description:  78 yo NH resident with dementia, leukemia and recently diagnosed pneumonia brought to [facility] in acute respiratory distress and intubated for acute respiratory failure.  Hypotensive requiring vasopressors.  Events Since Admission: 9/4 Brought to [facility] in acute respiratory distress and intubated for acute respiratory failure.  Hypotensive requiring vasopressors.  Current Status:  Vital Signs: Temp:  [90.1 F (32.3 C)] 90.1 F (32.3 C) (09/04 0315) Pulse Rate:  [43-72] 72  (09/04 0315) Resp:  [14] 14  (09/04 0315) BP: (70-92)/(41-55) 70/41 mmHg (09/04 0315) SpO2:  [91 %-100 %] 91 % (09/04 0315)  Physical Examination: General:  Intubated, synchronous Neuro:  Sedated, nonfocal, cough / gag present HEENT:  PERRL Neck:  No JVD Cardiovascular:  RRR, no m/r/g Lungs:  Bilateral air entry, coarse rhonchi Abdomen:  Soft, nontender, nondistended, bowel sounds present Musculoskeletal:  Moves allextremities Skin:  No rash  Active Problems:  LEUKEMIA, LYMPHOCYTIC, CHRONIC  DYSLIPIDEMIA  HYPERTENSION  DVT  TRANSIENT ISCHEMIC ATTACKS, HX OF  PULMONARY EMBOLISM, HX OF  Aspiration pneumonia  Septic shock  Acute respiratory failure  Acute encephalopathy  ASSESSMENT AND PLAN  PULMONARY  Lab 05/27/2012 0310  PHART 7.366  PCO2ART 38.0  PO2ART 90.9  HCO3 21.2  O2SAT 96.7   Ventilator Settings:   CXR:  9/4  >>> Hardware in good position.  Bilateral airspace disease. ETT:  9/4 >>>  A:   Acute respiratory failure.  HCAP. P:   Goal SpO2 > 92, pH . 7.30 Full mechanical support Daily SBT Combivent  CARDIOVASCULAR  Lab 05/26/2012 0200  TROPONINI --  LATICACIDVEN 2.1  PROBNP 1248.0*   ECG:  9/4  Atrial fibrillation, controlled rate Lines: R IJ CVL 9/4 >>>  A: Septic shock.  Atrial fibrillation / controlled rate.  CAD.  Dyslipidemia. PE/DVT on anticoagulation.  No evidence of ischemia. P:  Goal MAP > 60 Trend lactic acid Trend troponin Levophed gtt PRN ASA, Lipitor  RENAL  Lab 06/05/2012 0200  NA 147*  K 3.9  CL 110  CO2 27  BUN 23  CREATININE 1.06  CALCIUM 8.8  MG --  PHOS --   Intake/Output      09/03 0701 - 09/04 0700       Urine Occurrence 1 x    Foley:  9/4 >>>  A:  Dehydration.. Hypernatremia. P:   Goal CVP 10 to12 INF NS 100 mL/h Trend BMP Hold HCTZ  GASTROINTESTINAL  Lab 06/07/2012 0200  AST 18  ALT 17  ALKPHOS 187*  BILITOT 0.9  PROT 4.8*  ALBUMIN 2.3*   A:  No acute issues.  Suspected malnutrition. P:   NPO as intubated  HEMATOLOGIC  Lab 06/13/2012 0200  HGB 12.5*  HCT 36.8*  PLT 61*  INR 2.64*  APTT 83*   A:  Coagulopathy (Coumadin induced).  Thrombocytopenia.  Leukemia. P:  Trend CBC Coumadin per Pharmacy  INFECTIOUS  Lab 06/18/2012 0200  WBC 20.7*  PROCALCITON --   Cultures: 9/4  Blood >> 9/4  Respiratory >>>  Antibiotics: Zosyn 9/4 >>> Vancomycin 9/4 >>>  A:  Suspected HCAP. P:   Antibiotics / cultures as above PCT   ENDOCRINE  Lab 06/02/2012 0302  GLUCAP 111*   A:  No acute issues. P:   No interventions required.  NEUROLOGIC  A:  Acute encephalopathy.  Baseline dementia. P:   Goal RASS 0 to -1 Daily WUA Aricept  BEST PRACTICE / DISPOSITION Level of Care:  ICU Primary Service:  PCCM Consultants:  None Code Status:  Partial (already intubated, medications OK, no CPR / cardioversion) Diet:  NPO DVT Px:  Protonix GI Px:  Not indicated Skin Integrity:  Intact Social / Family:   Daughter (medical POA) updated at bedside.  Wants short trial of ICU care but is realistic and will discontinue life support if no improvement in 24-48 hours.  The patient is critically ill with multiple organ systems failure and requires high complexity decision making for assessment and support, frequent evaluation and titration of therapies, application of advanced monitoring technologies and extensive interpretation of multiple databases. Critical Care Time devoted to patient  care services described in this note is 50 minutes.  Lonia Farber, M.D. Pulmonary and Critical Care Medicine [facility] Pager: [phone]  2012/06/23, 3:45 AM

## 2012-05-24 NOTE — Progress Notes (Signed)
ANTICOAGULATION CONSULT NOTE  Pharmacy Consult for Warfarin Indication: Hx of DVT/PE, afib  Vital Signs: Temp: 96.8 F (36 C) (09/04 0800) Temp src: Core (Comment) (09/04 0605) BP: 68/43 mmHg (09/04 0800) Pulse Rate: 67  (09/04 0800)  Labs:  Basename 06/14/2012 1010 05/29/2012 0405 05/30/2012 0200  HGB -- -- 12.5*  HCT -- -- 36.8*  PLT -- -- 61*  APTT -- -- 83*  LABPROT 31.7* -- 28.6*  INR 3.01* -- 2.64*  HEPARINUNFRC -- -- --  CREATININE -- -- 1.06  CKTOTAL -- -- --  CKMB -- -- --  TROPONINI <0.30 <0.30 --   Assessment:  78 yo male admitted in acute respiratory failure- on chronic coumadin for history of DVT and PE. INR therapeutic on admission.  Per PCCM notes, plan to hold warfarin while critically ill and use heparin drip when INR < 2  Most recent INR 3.01  Goal of Therapy:  INR 2-3    Plan:   Warfarin consult canceled; No further warfarin doses  Daily PT/INR  Follow up plans for anticoagulation when INR < 2   Lynann Beaver PharmD, BCPS Pager [phone] 05/23/2012 11:53 AM

## 2012-05-24 NOTE — ED Provider Notes (Signed)
History     CSN: [id number]  Arrival date & time 06/07/2012  0205   First MD Initiated Contact with Patient 06/09/2012 0231      Chief Complaint  Patient presents with  . Respiratory Distress    (Consider location/radiation/quality/duration/timing/severity/associated sxs/prior treatment) HPI HX per EMS and nursing home, Dx with bilat PNA today and tonigth developed resp distress and hypoxia, EMS was called, given albuterol in route and min responsive.  On arrival to the ED, is non verbal eyes open and does not follow commands with obvious resp distress. Level 5 caveat applies. Full code per nursing home records Past Medical History  Diagnosis Date  . Lymphocytosis 10/06    absolute  . Vertigo     chronic   . Lymphocytic leukemia     chronic   . H/O: GI bleed     secondary to jejunal lesions   . HTN (hypertension)   . Dyslipidemia   . DVT (deep vein thrombosis) in pregnancy     hx  . PE (pulmonary embolism)     hx   . Hx-TIA (transient ischemic attack)   . Migraine     hx     Past Surgical History  Procedure Date  . Carotid doppler 01/18/01    nl; echo - mild MR, TR. EF wnl 01/18/01, lens implants, bil - 07/08/05, MRI with MRA - L ; frontal CVA 01/18/01  . Appendectomy   . R inguinal hernia repair     Family History  Problem Relation Age of Onset  . Coronary artery disease Neg Hx     premature CA     History  Substance Use Topics  . Smoking status: Unknown If Ever Smoked  . Smokeless tobacco: Not on file   Comment: tobacco use - no  . Alcohol Use: No      Review of Systems  Unable to perform ROS level 5 as above  Allergies  Review of patient's allergies indicates no known allergies.  Home Medications   Current Outpatient Rx  Name Route Sig Dispense Refill  . ASPIRIN 81 MG PO TBEC Oral Take 81 mg by mouth daily.      . DONEPEZIL HCL 10 MG PO TABS Oral Take 10 mg by mouth daily.      Marland Kitchen HYDROCHLOROTHIAZIDE 25 MG PO TABS Oral Take 25 mg by mouth daily.        Marland Kitchen MECLIZINE HCL 25 MG PO TABS Oral Take 25 mg by mouth daily.      Marland Kitchen ONE-DAILY MULTI VITAMINS PO TABS Oral Take 1 tablet by mouth daily.      Marland Kitchen PANTOPRAZOLE SODIUM 40 MG PO TBEC Oral Take 40 mg by mouth daily.      Marland Kitchen SIMVASTATIN 20 MG PO TABS Oral Take 20 mg by mouth at bedtime.      . TAMSULOSIN HCL 0.4 MG PO CAPS Oral Take 0.4 mg by mouth daily.      . WARFARIN SODIUM 2 MG PO TABS Oral Take 2 mg by mouth as directed.        Pulse 43  Physical Exam  Constitutional:       resp distress  HENT:  Head: Normocephalic and atraumatic.  Eyes: Conjunctivae are normal. Pupils are equal, round, and reactive to light.  Neck: No tracheal deviation present.  Cardiovascular:       Huston Foley and irreg  Pulmonary/Chest: No stridor.       diminished bilateral breath sounds  Abdominal: Soft. He exhibits  no distension.  Genitourinary: Penis normal.  Musculoskeletal:       Cool and clammy, no purposeful movements  Neurological:       Eyes open, non verbal, does not follow commands  Skin: No rash noted.    ED Course  INTUBATION Date/Time: 06/02/2012 2:24 AM Performed by: Sunnie Nielsen Authorized by: Sunnie Nielsen Consent: The procedure was performed in an emergent situation. Patient identity confirmed: arm band Time out: Immediately prior to procedure a "time out" was called to verify the correct patient, procedure, equipment, support staff and site/side marked as required. Indications: respiratory failure Intubation method: direct Patient status: paralyzed (RSI) Pretreatment medications: atropine Sedatives: etomidate Paralytic: succinylcholine Laryngoscope size: Mac 4 Tube size: 8.0 mm Tube type: cuffed Number of attempts: 2 Ventilation between attempts: BVM Cricoid pressure: yes Cords visualized: yes Post-procedure assessment: ETCO2 monitor Breath sounds: equal (dimished bilat) Cuff inflated: yes ETT to lip: 24 cm Tube secured with: ETT holder Chest x-ray interpreted by me. Patient  tolerance: Patient tolerated the procedure well with no immediate complications.  CENTRAL LINE Date/Time: 06/13/2012 2:30 AM Performed by: Sunnie Nielsen Authorized by: Sunnie Nielsen Consent: The procedure was performed in an emergent situation. Required items: required blood products, implants, devices, and special equipment available Patient identity confirmed: arm band Time out: Immediately prior to procedure a "time out" was called to verify the correct patient, procedure, equipment, support staff and site/side marked as required. Indications: vascular access Anesthesia: local infiltration Local anesthetic: lidocaine 1% without epinephrine Anesthetic total: 2 ml Preparation: skin prepped with 2% chlorhexidine Skin prep agent dried: skin prep agent completely dried prior to procedure Sterile barriers: all five maximum sterile barriers used - cap, mask, sterile gown, sterile gloves, and large sterile sheet Hand hygiene: hand hygiene performed prior to central venous catheter insertion Location details: right internal jugular Catheter type: triple lumen Pre-procedure: landmarks identified Ultrasound guidance: yes Number of attempts: 1 Successful placement: yes Post-procedure: line sutured and dressing applied Assessment: blood return through all parts, free fluid flow and placement verified by x-ray Patient tolerance: Patient tolerated the procedure well with no immediate complications.   (including critical care time)  Results for orders placed during the hospital encounter of 06/18/2012  CBC WITH DIFFERENTIAL      Component Value Range   WBC 20.7 (*) 4.0 - 10.5 K/uL   RBC 3.65 (*) 4.22 - 5.81 MIL/uL   Hemoglobin 12.5 (*) 13.0 - 17.0 g/dL   HCT 16.1 (*) 09.6 - 04.5 %   MCV 100.8 (*) 78.0 - 100.0 fL   MCH 34.2 (*) 26.0 - 34.0 pg   MCHC 34.0  30.0 - 36.0 g/dL   RDW 40.9  81.1 - 91.4 %   Platelets 61 (*) 150 - 400 K/uL   Neutrophils Relative 30 (*) 43 - 77 %   Lymphocytes Relative  65 (*) 12 - 46 %   Monocytes Relative 5  3 - 12 %   Eosinophils Relative 0  0 - 5 %   Basophils Relative 0  0 - 1 %   Neutro Abs 6.2  1.7 - 7.7 K/uL   Lymphs Abs 13.5 (*) 0.7 - 4.0 K/uL   Monocytes Absolute 1.0  0.1 - 1.0 K/uL   Eosinophils Absolute 0.0  0.0 - 0.7 K/uL   Basophils Absolute 0.0  0.0 - 0.1 K/uL   WBC Morphology ABSOLUTE LYMPHOCYTOSIS     Smear Review PENDING PATHOLOGIST REVIEW    PROTIME-INR      Component Value Range  Prothrombin Time 28.6 (*) 11.6 - 15.2 seconds   INR 2.64 (*) 0.00 - 1.49  APTT      Component Value Range   aPTT 83 (*) 24 - 37 seconds  PROCALCITONIN      Component Value Range   Procalcitonin 0.11    LACTATE DEHYDROGENASE      Component Value Range   LDH 175  94 - 250 U/L  COMPREHENSIVE METABOLIC PANEL      Component Value Range   Sodium 147 (*) 135 - 145 mEq/L   Potassium 3.9  3.5 - 5.1 mEq/L   Chloride 110  96 - 112 mEq/L   CO2 27  19 - 32 mEq/L   Glucose, Bld 122 (*) 70 - 99 mg/dL   BUN 23  6 - 23 mg/dL   Creatinine, Ser 1.61  0.50 - 1.35 mg/dL   Calcium 8.8  8.4 - 09.6 mg/dL   Total Protein 4.8 (*) 6.0 - 8.3 g/dL   Albumin 2.3 (*) 3.5 - 5.2 g/dL   AST 18  0 - 37 U/L   ALT 17  0 - 53 U/L   Alkaline Phosphatase 187 (*) 39 - 117 U/L   Total Bilirubin 0.9  0.3 - 1.2 mg/dL   GFR calc non Af Amer 63 (*) >90 mL/min   GFR calc Af Amer 73 (*) >90 mL/min  PRO B NATRIURETIC PEPTIDE      Component Value Range   Pro B Natriuretic peptide (BNP) 1248.0 (*) 0 - 450 pg/mL  URINALYSIS, ROUTINE W REFLEX MICROSCOPIC      Component Value Range   Color, Urine YELLOW  YELLOW   APPearance CLOUDY (*) CLEAR   Specific Gravity, Urine 1.021  1.005 - 1.030   pH 5.0  5.0 - 8.0   Glucose, UA NEGATIVE  NEGATIVE mg/dL   Hgb urine dipstick NEGATIVE  NEGATIVE   Bilirubin Urine NEGATIVE  NEGATIVE   Ketones, ur NEGATIVE  NEGATIVE mg/dL   Protein, ur NEGATIVE  NEGATIVE mg/dL   Urobilinogen, UA 0.2  0.0 - 1.0 mg/dL   Nitrite NEGATIVE  NEGATIVE   Leukocytes, UA  TRACE (*) NEGATIVE  LACTIC ACID, PLASMA      Component Value Range   Lactic Acid, Venous 2.1  0.5 - 2.2 mmol/L  POCT I-STAT TROPONIN I      Component Value Range   Troponin i, poc 0.00  0.00 - 0.08 ng/mL   Comment 3           TYPE AND SCREEN      Component Value Range   ABO/RH(D) A POS     Antibody Screen NEG     Sample Expiration 05/27/2012    OCCULT BLOOD, POC DEVICE      Component Value Range   Fecal Occult Bld POSITIVE    BLOOD GAS, ARTERIAL      Component Value Range   FIO2 0.60     Delivery systems VENTILATOR     Mode PRESSURE REGULATED VOLUME CONTROL     VT 600     Rate 14     Peep/cpap 5.0     pH, Arterial 7.366  7.350 - 7.450   pCO2 arterial 38.0  35.0 - 45.0 mmHg   pO2, Arterial 90.9  80.0 - 100.0 mmHg   Bicarbonate 21.2  20.0 - 24.0 mEq/L   TCO2 19.9  0 - 100 mmol/L   Acid-base deficit 3.2 (*) 0.0 - 2.0 mmol/L   O2 Saturation 96.7  Patient temperature 98.6     Collection site RIGHT RADIAL     Drawn by [phone]     Sample type ARTERIAL DRAW     Allens test (pass/fail) PASS  PASS  GLUCOSE, CAPILLARY      Component Value Range   Glucose-Capillary 111 (*) 70 - 99 mg/dL  TROPONIN I      Component Value Range   Troponin I <0.30  <0.30 ng/mL  URINE MICROSCOPIC-ADD ON      Component Value Range   Squamous Epithelial / LPF RARE  RARE   WBC, UA 0-2  <3 WBC/hpf   RBC / HPF 0-2  <3 RBC/hpf   Bacteria, UA MANY (*) RARE   Urine-Other MUCOUS PRESENT     Dg Chest Portable 1 View  06/19/2012  *RADIOLOGY REPORT*  Clinical Data: Respiratory distress  PORTABLE CHEST - 1 VIEW  Comparison: Portable exam 0346 hours compared to 0225 hours  Findings: Tip of endotracheal tube 3.2 cm above carina. Nasogastric tube projects over stomach. Right jugular central venous catheter, tip projects over SVC. Stable heart size and mediastinal contours. Bilateral pulmonary infiltrates, little changed. No pneumothorax.  IMPRESSION: Bilateral pulmonary infiltrates question edema versus infection.    Original Report Authenticated By: Lollie Marrow, M.D.    Dg Chest Port 1 View  05/23/2012  *RADIOLOGY REPORT*  Clinical Data: Intubation and central line placement, respiratory distress  PORTABLE CHEST - 1 VIEW  Comparison: Portable exam 0225 hours compared to 11/29/2007  Findings: Tip of endotracheal tube 5.4 cm above carina. Right jugular central venous catheter, tip projecting over SVC. Normal heart size, mediastinal contours, and pulmonary vascularity. Atherosclerotic calcification of a minimally tortuous thoracic aorta. Bilateral perihilar and basilar infiltrates. Small layered pleural effusion at left lung base not excluded. No pneumothorax. Diffuse osseous demineralization.  IMPRESSION: Line and tube positions as above. Perihilar infiltrates question edema versus infection.   Original Report Authenticated By: Lollie Marrow, M.D.     CRITICAL CARE Performed by: Sunnie Nielsen   Total critical care time: 60  Critical care time was exclusive of separately billable procedures and treating other patients.  Critical care was necessary to treat or prevent imminent or life-threatening deterioration.  Critical care was time spent personally by me on the following activities: development of treatment plan with patient and/or surrogate as well as nursing, discussions with consultants, evaluation of patient's response to treatment, examination of patient, obtaining history from patient or surrogate, ordering and performing treatments and interventions, ordering and review of laboratory studies, ordering and review of radiographic studies, pulse oximetry and re-evaluation of patient's condition. After 3L IVFs and started on levo for severe hypotension, BP 92/55. Vent management, sedation, PCCM consult. IV ABX, titrating pressors for persistent hypotention.  2:38 AM C/S to PCCM - d/w Dr Darrick Penna - will plan ICU admit   Date: 05/26/2012  Rate: 49  Rhythm: sinus bradycardia  QRS Axis: normal  Intervals:  normal  ST/T Wave abnormalities: nonspecific ST changes  Conduction Disutrbances:nonspecific intraventricular conduction delay  Narrative Interpretation:   Old EKG Reviewed: unchanged   MDM   VS and nursing notes reviewed, records from nursing home reviewed. ECG, CXR, labs, IVFs, IV ABX, pressors, intubation, central line, ICU admit        Sunnie Nielsen, MD 06/01/2012 [phone]

## 2012-05-24 NOTE — ED Notes (Signed)
Pt from [facility]; presents via EMS with severe respiratory distress; chest congestion; intubated on EMS arrival

## 2012-05-24 NOTE — Progress Notes (Signed)
ANTICOAGULATION CONSULT NOTE - Initial Consult  Pharmacy Consult for Warfarin Indication: Hx of DVT/PE  No Known Allergies  Patient Measurements:  Wt=98.4 kg   Vital Signs: Temp: 90.8 F (32.7 C) (09/04 0345) Temp src: Core (Comment) (09/04 0249) BP: 84/50 mmHg (09/04 0345) Pulse Rate: 74  (09/04 0345)  Labs:  Basename 06/09/2012 0200  HGB 12.5*  HCT 36.8*  PLT 61*  APTT 83*  LABPROT 28.6*  INR 2.64*  HEPARINUNFRC --  CREATININE 1.06  CKTOTAL --  CKMB --  TROPONINI --    The CrCl is unknown because both a height and weight (above a minimum accepted value) are required for this calculation.   Medical History: Past Medical History  Diagnosis Date  . Lymphocytosis 10/06    absolute  . Vertigo     chronic   . Lymphocytic leukemia     chronic   . H/O: GI bleed     secondary to jejunal lesions   . HTN (hypertension)   . Dyslipidemia   . DVT (deep vein thrombosis) in pregnancy     hx  . PE (pulmonary embolism)     hx   . Hx-TIA (transient ischemic attack)   . Migraine     hx     Medications:  Scheduled:    . albuterol      . albuterol-ipratropium  6 puff Inhalation Q4H  . antiseptic oral rinse  1 application Mouth Rinse QID  . aspirin EC  81 mg Oral Daily  . atropine      . chlorhexidine  15 mL Mouth/Throat BID  . donepezil  10 mg Oral Daily  . etomidate  20 mg Intravenous Once  . ipratropium      . methylPREDNISolone sodium succinate      . pantoprazole (PROTONIX) IV  40 mg Intravenous Q24H  . piperacillin-tazobactam (ZOSYN)  IV  3.375 g Intravenous Once  . simvastatin  20 mg Oral QHS  . succinylcholine  120 mg Intravenous Once  . Tamsulosin HCl  0.4 mg Oral Daily  . vancomycin  1,000 mg Intravenous Once  . DISCONTD: aspirin  81 mg Oral Daily   Infusions:    . sodium chloride 100 mL/hr at 06/11/2012 0353  . fentaNYL infusion INTRAVENOUS 100 mcg/hr ( 0330)  . midazolam (VERSED) infusion 4 mg/hr (06/07/2012 0311)  . norepinephrine  (LEVOPHED) Adult infusion 8.5 mcg/min (05/21/2012 0350)    Assessment: 78 yo male admitted in acute respiratory failure- on chronic coumadin for history of DVT and PE. INR therapeutic on admission. Goal of Therapy:  INR 2-3    Plan:   Daily PT/INR  Lorenza Evangelist 05/27/2012,4:48 AM

## 2012-05-24 NOTE — Progress Notes (Signed)
CARE MANAGEMENT NOTE June 04, 2012  Patient:  Ernest Kramer, Ernest Kramer   Account Number:  [id number]  Date Initiated:  Jun 04, 2012  Documentation initiated by:  Ronal Maybury  Subjective/Objective Assessment:   pr DMITTED FROM SNF WITH RESP FAILURE AND PLACED ON VENT     Action/Plan:   from [facility] SNF   Anticipated DC Date:  05/27/2012   Anticipated DC Plan:  SKILLED NURSING FACILITY  In-house referral  Clinical Social Worker      DC Planning Services  NA      [facility] Choice  NA   Choice offered to / List presented to:  NA   DME arranged  NA      DME agency  NA     HH arranged  NA      HH agency  NA   Status of service:  In process, will continue to follow Medicare Important Message given?  NA - LOS <3 / Initial given by admissions (If response is "NO", the following Medicare IM given date fields will be blank) Date Medicare IM given:   Date Additional Medicare IM given:    Discharge Disposition:    Per UR Regulation:  Reviewed for med. necessity/level of care/duration of stay  If discussed at Long Length of Stay Meetings, dates discussed:    Comments:  [id number] Earlene Plater, RN, BSN, CCM: CHART REVIEWED AND UPDATED. NO DISCHARGE NEEDS PRESENT AT THIS TIME. CASE MANAGEMENT [phone]

## 2012-05-24 NOTE — ED Notes (Signed)
JYN:WGNF<AO> Expected date:06/12/2012<BR> Expected time: 1:33 AM<BR> Means of arrival:Ambulance<BR> Comments:<BR> Severe resp distress

## 2012-05-25 ENCOUNTER — Encounter (HOSPITAL_COMMUNITY): Payer: Self-pay | Admitting: Pulmonary Disease

## 2012-05-25 ENCOUNTER — Inpatient Hospital Stay (HOSPITAL_COMMUNITY): Payer: Medicare Other

## 2012-05-25 DIAGNOSIS — C9110 Chronic lymphocytic leukemia of B-cell type not having achieved remission: Secondary | ICD-10-CM

## 2012-05-25 LAB — GLUCOSE, CAPILLARY

## 2012-05-25 LAB — CBC
MCV: 99.7 fL (ref 78.0–100.0)
Platelets: 86 10*3/uL — ABNORMAL LOW (ref 150–400)
RDW: 15.7 % — ABNORMAL HIGH (ref 11.5–15.5)
WBC: 32.4 10*3/uL — ABNORMAL HIGH (ref 4.0–10.5)

## 2012-05-25 LAB — BLOOD GAS, ARTERIAL
Acid-base deficit: 2.5 mmol/L — ABNORMAL HIGH (ref 0.0–2.0)
FIO2: 0.3 %
MECHVT: 0.6 mL
O2 Saturation: 98 %
Patient temperature: 96
TCO2: 18.9 mmol/L (ref 0–100)

## 2012-05-25 LAB — COMPREHENSIVE METABOLIC PANEL
AST: 13 U/L (ref 0–37)
Albumin: 1.9 g/dL — ABNORMAL LOW (ref 3.5–5.2)
Chloride: 114 mEq/L — ABNORMAL HIGH (ref 96–112)
Creatinine, Ser: 1.15 mg/dL (ref 0.50–1.35)
Total Bilirubin: 0.7 mg/dL (ref 0.3–1.2)

## 2012-05-25 LAB — APTT: aPTT: 81 seconds — ABNORMAL HIGH (ref 24–37)

## 2012-05-25 LAB — PROTIME-INR: INR: 4.11 — ABNORMAL HIGH (ref 0.00–1.49)

## 2012-05-25 MED ORDER — OXEPA PO LIQD
1000.0000 mL | ORAL | Status: DC
  Administered 2012-05-25 – 2012-05-28 (×4): 1000 mL
  Filled 2012-05-25 (×6): qty 1000

## 2012-05-25 NOTE — Progress Notes (Signed)
Name: Ernest Kramer MRN: [id number] DOB: 1927/10/09    LOS: 1  Referring Provider:  EDP Reason for Referral:  Acute respiratory failure  PULMONARY / CRITICAL CARE MEDICINE  Brief patient description:  78 yo male admitted 06/02/2012 from NH with septic shock, PNA, and VDRF. Hx of dementia, CLL.  Events Since Admission: 9/04 Sepsis protocol started  Current Status: More interactive, tolerating some pressure support, weaning levophed.  Vital Signs: Temp:  [95 F (35 C)-99.1 F (37.3 C)] 98.2 F (36.8 C) (09/05 0800) Pulse Rate:  [50-83] 77  (09/05 0817) Resp:  [13-19] 19  (09/05 0817) BP: (85-174)/(41-111) 117/91 mmHg (09/05 0817) SpO2:  [92 %-100 %] 99 % (09/05 0817) FiO2 (%):  [30 %-50 %] 30 % (09/05 0817) Weight:  [189 lb 6 oz (85.9 kg)] 189 lb 6 oz (85.9 kg) (09/05 0326)  Physical Examination: General: No distress Neuro: Somnolent, follows some commands, resting tremor HEENT: ETT in place Neck:  Rt IJ site clean Cardiovascular: s1s2, no murmur Lungs: basilar rales, no wheeze Abdomen: soft, non-tender, +bowel sounds Musculoskeletal: no edema Skin:  No rash  Dg Chest Port 1 View  05/25/2012  *RADIOLOGY REPORT*  Clinical Data: Follow up pneumonia.  PORTABLE CHEST - 1 VIEW  Comparison: 05/25/2012.  Findings: Endotracheal tube tip is 2.4 cm from the carina.  Enteric tube and right IJ central line remain present, unchanged in position.  The tip of the nasogastric tube is at the cardia of the stomach.  Left pleural effusion and left greater than right basilar atelectasis.  Probable airspace disease at the left lung base. Atelectasis at the right lung base has been increasing since 06/17/2012.  IMPRESSION:  1.  Support apparatus in good position as described above. 2.  Stable pulmonary aeration with bilateral basilar opacities, left greater than right, likely a combination of airspace disease and atelectasis.   Original Report Authenticated By: Andreas Newport, M.D.    Dg Chest  Portable 1 View  06/13/2012  *RADIOLOGY REPORT*  Clinical Data: Respiratory distress  PORTABLE CHEST - 1 VIEW  Comparison: Portable exam 0346 hours compared to 0225 hours  Findings: Tip of endotracheal tube 3.2 cm above carina. Nasogastric tube projects over stomach. Right jugular central venous catheter, tip projects over SVC. Stable heart size and mediastinal contours. Bilateral pulmonary infiltrates, little changed. No pneumothorax.  IMPRESSION: Bilateral pulmonary infiltrates question edema versus infection.   Original Report Authenticated By: Lollie Marrow, M.D.    Dg Chest Port 1 View  06/08/2012  *RADIOLOGY REPORT*  Clinical Data: Intubation and central line placement, respiratory distress  PORTABLE CHEST - 1 VIEW  Comparison: Portable exam 0225 hours compared to 11/29/2007  Findings: Tip of endotracheal tube 5.4 cm above carina. Right jugular central venous catheter, tip projecting over SVC. Normal heart size, mediastinal contours, and pulmonary vascularity. Atherosclerotic calcification of a minimally tortuous thoracic aorta. Bilateral perihilar and basilar infiltrates. Small layered pleural effusion at left lung base not excluded. No pneumothorax. Diffuse osseous demineralization.  IMPRESSION: Line and tube positions as above. Perihilar infiltrates question edema versus infection.   Original Report Authenticated By: Lollie Marrow, M.D.     ASSESSMENT AND PLAN  PULMONARY  Lab 05/25/12 0334 06/08/2012 1300 06/18/2012 0310  PHART 7.463* -- 7.366  PCO2ART 28.5* -- 38.0  PO2ART 74.0* -- 90.9  HCO3 20.5 -- 21.2  O2SAT 98.0 85.1 96.7   Ventilator Settings: Vent Mode:  [-] PRVC FiO2 (%):  [30 %-50 %] 30 % Set Rate:  [14 bmp]  14 bmp Vt Set:  [600 mL] 600 mL PEEP:  [5 cmH20] 5 cmH20 Plateau Pressure:  [14 cmH20-17 cmH20] 14 cmH20   ETT:  9/4 >>>  A:  Acute respiratory failure 2nd to PNA. P:   Pressure support wean as tolerated F/u CXR PRN BD's  CARDIOVASCULAR  Lab 06/11/2012 1510  05/22/2012 1010 06/02/2012 1000 05/26/2012 0405 06/10/2012 0200  TROPONINI <0.30 <0.30 -- <0.30 --  LATICACIDVEN 4.1* -- 5.8* -- 2.1  PROBNP -- -- -- -- 1248.0*   Lines: R IJ CVL 9/4 >>>  A: Septic shock 2nd to PNA.  Hx of Atrial fibrillation / controlled rate, CAD, Dyslipidemia, PE/DVT on anticoagulation. P:  Wean off pressors to keep SBP > 90, MAP > 65 IV fluids to keep CVP > 8 Solucortef started 9/04 Continue ASA, simvastatin Hold norvasc, lopressor for now  RENAL  Lab 05/25/12 0430 06/14/2012 1115 06/02/2012 0200  NA 144 144 147*  K 3.5 3.9 --  CL 114* 112 110  CO2 23 20 27   BUN 23 23 23   CREATININE 1.15 1.17 1.06  CALCIUM 8.0* 8.2* 8.8  MG 1.8 1.7 --  PHOS -- -- --   Intake/Output      09/04 0701 - 09/05 0700 09/05 0701 - 09/06 0700   I.V. (mL/kg) 7990.2 (93)    IV Piggyback 250    Total Intake(mL/kg) 8240.2 (95.9)    Urine (mL/kg/hr) 510 (0.2) 100   Total Output 510 100   Net +7730.2 -100         Foley:  9/4 >>>  A: Hypernatremia>>improved. P:   F/u BMET Monitor electrolytes, urine outpt  GASTROINTESTINAL  Lab 05/25/12 0430 05/27/2012 1115 06/19/2012 0200  AST 13 15 18   ALT 14 16 17   ALKPHOS 150* 179* 187*  BILITOT 0.7 0.8 0.9  PROT 4.2* 4.5* 4.8*  ALBUMIN 1.9* 2.0* 2.3*   A: Protein calorie malnutrition. P:   Start oxepa 9/05  HEMATOLOGIC  Lab 05/25/12 0430 05/26/2012 1010 06/11/2012 0200  HGB 10.0* -- 12.5*  HCT 29.1* -- 36.8*  PLT 86* -- 61*  INR 4.11* 3.01* 2.64*  APTT 81* -- 83*   A:  Coagulopathy (Coumadin induced).   Thrombocytopenia.   Hx of chronic lymphocytic leukemia. P:  F/u CBC F/u INR>>resume coumadin when INR < 2  INFECTIOUS  Lab 05/25/12 0430 06/07/2012 1115 06/19/2012 0200  WBC 32.4* -- 20.7*  PROCALCITON -- 0.12 0.11   Cultures: 9/4  Blood >> 9/4  Respiratory >>>  Antibiotics: Zosyn 9/4 >>> Vancomycin 9/4 >>>  A:  Suspected HCAP vs aspiration. P:   D2/x zosyn, vancomycin  ENDOCRINE  Lab 05/25/12 0801 05/25/12 0345  05/25/12 0008 06/11/2012 2005 06/06/2012 1714  GLUCAP 102* 133* 107* 112* 189*   A:  Hyperglycemia  P:   SSI  NEUROLOGIC  A:  Acute encephalopathy 2nd to sepsis, PNA, hypoxia. Hx of dementia. P:   Goal RASS 0 to -1 Intermittent sedation protocol Continue aricept  BEST PRACTICE / DISPOSITION Level of Care:  ICU Primary Service:  PCCM Consultants:  None Code Status:  Partial (already intubated, medications OK, no CPR / cardioversion) Diet:  Tube feeds DVT Px: chronic anti-coaguation with coumadin GI Px: protonix Skin Integrity:  Intact Social / Family:  Updated family  Critical care time 35 minutes.  Coralyn Helling, MD [facility] Pulmonary/Critical Care 05/25/2012, 9:10 AM Pager:  [phone] After 3pm call: [phone]

## 2012-05-26 ENCOUNTER — Inpatient Hospital Stay (HOSPITAL_COMMUNITY): Payer: Medicare Other

## 2012-05-26 LAB — BASIC METABOLIC PANEL
BUN: 25 mg/dL — ABNORMAL HIGH (ref 6–23)
Chloride: 117 mEq/L — ABNORMAL HIGH (ref 96–112)
Glucose, Bld: 113 mg/dL — ABNORMAL HIGH (ref 70–99)
Potassium: 3.3 mEq/L — ABNORMAL LOW (ref 3.5–5.1)

## 2012-05-26 LAB — GLUCOSE, CAPILLARY
Glucose-Capillary: 106 mg/dL — ABNORMAL HIGH (ref 70–99)
Glucose-Capillary: 106 mg/dL — ABNORMAL HIGH (ref 70–99)
Glucose-Capillary: 118 mg/dL — ABNORMAL HIGH (ref 70–99)

## 2012-05-26 LAB — CBC
HCT: 25.6 % — ABNORMAL LOW (ref 39.0–52.0)
Hemoglobin: 8.7 g/dL — ABNORMAL LOW (ref 13.0–17.0)
MCH: 34.1 pg — ABNORMAL HIGH (ref 26.0–34.0)
MCHC: 34 g/dL (ref 30.0–36.0)

## 2012-05-26 MED ORDER — HYDROCORTISONE SOD SUCCINATE 100 MG IJ SOLR
50.0000 mg | Freq: Three times a day (TID) | INTRAMUSCULAR | Status: DC
  Administered 2012-05-26 – 2012-05-27 (×2): 50 mg via INTRAVENOUS
  Filled 2012-05-26 (×3): qty 1

## 2012-05-26 MED ORDER — ASPIRIN 81 MG PO CHEW
81.0000 mg | CHEWABLE_TABLET | Freq: Every day | ORAL | Status: DC
  Administered 2012-05-26 – 2012-05-31 (×6): 81 mg via ORAL
  Filled 2012-05-26 (×6): qty 1

## 2012-05-26 MED ORDER — METOPROLOL TARTRATE 25 MG/10 ML ORAL SUSPENSION
12.5000 mg | Freq: Two times a day (BID) | ORAL | Status: DC
  Administered 2012-05-26 – 2012-05-31 (×9): 12.5 mg
  Filled 2012-05-26 (×13): qty 5

## 2012-05-26 NOTE — Progress Notes (Signed)
Name: Ernest Kramer MRN: [id number] DOB: 1928/01/08    LOS: 2  Referring Provider:  EDP Reason for Referral:  Acute respiratory failure  PULMONARY / CRITICAL CARE MEDICINE  Brief patient description:  78 yo male admitted 06/13/2012 from NH with septic shock, PNA, and VDRF. Hx of dementia, CLL.  Events Since Admission: 9/04 Sepsis protocol started 9/05 Off pressors  Current Status: More alert.  Off pressors.  Tolerating SBT.    Vital Signs: Temp:  [96.4 F (35.8 C)-99.5 F (37.5 C)] 99.5 F (37.5 C) (09/06 0600) Pulse Rate:  [53-93] 84  (09/06 0600) Resp:  [13-23] 17  (09/06 0600) BP: (94-186)/(38-135) 118/104 mmHg (09/06 0600) SpO2:  [96 %-100 %] 98 % (09/06 0600) FiO2 (%):  [30 %] 30 % (09/06 0749) Weight:  [200 lb 9.9 oz (91 kg)] 200 lb 9.9 oz (91 kg) (09/06 0300)  Physical Examination: General: No distress Neuro: Somnolent, follows some commands, resting tremor HEENT: ETT in place Neck:  Rt IJ site clean Cardiovascular: s1s2, no murmur Lungs: basilar rales, no wheeze Abdomen: soft, non-tender, +bowel sounds Musculoskeletal: no edema Skin:  No rash  Dg Chest Port 1 View  05/26/2012  *RADIOLOGY REPORT*  Clinical Data: Pneumonia  PORTABLE CHEST - 1 VIEW  Comparison: 05/25/2012  Findings: Endotracheal tube, NG tube, right internal jugular vein central venous catheter are stable.  Bibasilar pulmonary opacities left greater than right are stable.  Low lung volumes.  No pneumothorax.  IMPRESSION: Stable bibasilar opacities left greater than right.   Original Report Authenticated By: Donavan Burnet, M.D.    Dg Chest Port 1 View  05/25/2012  *RADIOLOGY REPORT*  Clinical Data: Follow up pneumonia.  PORTABLE CHEST - 1 VIEW  Comparison: .  Findings: Endotracheal tube tip is 2.4 cm from the carina.  Enteric tube and right IJ central line remain present, unchanged in position.  The tip of the nasogastric tube is at the cardia of the stomach.  Left pleural effusion and  left greater than right basilar atelectasis.  Probable airspace disease at the left lung base. Atelectasis at the right lung base has been increasing since 06/17/2012.  IMPRESSION:  1.  Support apparatus in good position as described above. 2.  Stable pulmonary aeration with bilateral basilar opacities, left greater than right, likely a combination of airspace disease and atelectasis.   Original Report Authenticated By: Andreas Newport, M.D.     ASSESSMENT AND PLAN  PULMONARY  Lab 05/25/12 0334 05/23/2012 1300 05/22/2012 0310  PHART 7.463* -- 7.366  PCO2ART 28.5* -- 38.0  PO2ART 74.0* -- 90.9  HCO3 20.5 -- 21.2  O2SAT 98.0 85.1 96.7   Ventilator Settings: Vent Mode:  [-] CPAP FiO2 (%):  [30 %] 30 % Set Rate:  [14 bmp] 14 bmp Vt Set:  [600 mL] 600 mL PEEP:  [5 cmH20] 5 cmH20 Pressure Support:  [5 cmH20-15 cmH20] 5 cmH20 Plateau Pressure:  [12 cmH20-13 cmH20] 13 cmH20   ETT:  9/4 >>>  A:  Acute respiratory failure 2nd to PNA. P:   Pressure support wean as tolerated>>may be ready for extubation soon; need to determine plan if he develops respiratory failure again after extubation F/u CXR PRN BD's  CARDIOVASCULAR  Lab  1510 05/31/2012 1010 06/17/2012 1000 06/07/2012 0405 06/01/2012 0200  TROPONINI <0.30 <0.30 -- <0.30 --  LATICACIDVEN 4.1* -- 5.8* -- 2.1  PROBNP -- -- -- -- 1248.0*   Lines: R IJ CVL 9/4 >>>  A: Septic shock 2nd to PNA>>resolved 9/05.  Hx of Atrial fibrillation / controlled rate, CAD, Dyslipidemia, PE/DVT on anticoagulation. P:  Solucortef started 9/04>>wean off over next several days Continue ASA, simvastatin Resume lopressor Hold norvasc Will need to determine if he is suitable candidate to resume coumadin  RENAL  Lab 05/26/12 0355 05/25/12 0430  1115 06/17/2012 0200  NA 148* 144 144 147*  K 3.3* 3.5 -- --  CL 117* 114* 112 110  CO2 23 23 20 27   BUN 25* 23 23 23   CREATININE 1.12 1.15 1.17 1.06  CALCIUM 7.8* 8.0* 8.2* 8.8  MG -- 1.8 1.7 --    PHOS -- -- -- --   Intake/Output      09/05 0701 - 09/06 0700 09/06 0701 - 09/07 0700   I.V. (mL/kg) 2662.5 (29.3)    Other 90    NG/GT 610    IV Piggyback 437.5    Total Intake(mL/kg) 3800 (41.8)    Urine (mL/kg/hr) 895 (0.4)    Total Output 895    Net +2905         Stool Occurrence 1 x     Foley:  9/4 >>>  A: Hypernatremia. P:   F/u BMET Monitor electrolytes, urine outpt  GASTROINTESTINAL  Lab 05/25/12 0430 06/18/2012 1115 05/22/2012 0200  AST 13 15 18   ALT 14 16 17   ALKPHOS 150* 179* 187*  BILITOT 0.7 0.8 0.9  PROT 4.2* 4.5* 4.8*  ALBUMIN 1.9* 2.0* 2.3*   A: Protein calorie malnutrition. P:   Started oxepa 9/05 Will need swallow eval after extubation  HEMATOLOGIC  Lab 05/26/12 0355 05/25/12 0430 05/31/2012 1010 06/02/2012 0200  HGB 8.7* 10.0* -- 12.5*  HCT 25.6* 29.1* -- 36.8*  PLT 89* 86* -- 61*  INR 3.82* 4.11* 3.01* 2.64*  APTT -- 81* -- 83*   A:  Coagulopathy (Coumadin induced).   Thrombocytopenia.   Hx of chronic lymphocytic leukemia. P:  F/u CBC F/u INR>>resume coumadin when INR < 2  INFECTIOUS  Lab 05/26/12 0355 05/25/12 0430 06/01/2012 1115 06/16/2012 0200  WBC 21.7* 32.4* -- 20.7*  PROCALCITON -- -- 0.12 0.11   Cultures: 9/4  Blood >>  Antibiotics: Zosyn 9/4 >>> Vancomycin 9/4 >>>  A:  Suspected HCAP vs aspiration. P:   D3/x zosyn, vancomycin  ENDOCRINE  Lab 05/26/12 0823 05/26/12 0338 05/25/12 2341 05/25/12 2001 05/25/12 1725  GLUCAP 118* 115* 106* 102* 122*   A:  Hyperglycemia  P:   SSI  NEUROLOGIC  A:  Acute encephalopathy 2nd to sepsis, PNA, hypoxia. Tremor. Hx of dementia. P:   Goal RASS 0 to -1 Intermittent sedation protocol Continue aricept  BEST PRACTICE / DISPOSITION Level of Care:  ICU Primary Service:  PCCM Consultants:  None Code Status:  Partial (already intubated, medications OK, no CPR / cardioversion) Diet:  Tube feeds DVT Px: chronic anti-coaguation with coumadin GI Px: protonix Skin Integrity:   Intact Social / Family:  Updated family  Critical care time 35 minutes.  Coralyn Helling, MD [facility] Pulmonary/Critical Care 05/26/2012, 10:04 AM Pager:  [phone] After 3pm call: [phone]

## 2012-05-26 NOTE — Clinical Social Work Psychosocial (Signed)
Clinical Social Work Department BRIEF PSYCHOSOCIAL ASSESSMENT 05/26/2012  Patient:  Ernest Kramer, Ernest Kramer     Account Number:  [id number]     Admit date:  06/01/2012  Clinical Social Worker:  Jodelle Red  Date/Time:  05/26/2012 09:45 AM  Referred by:  CSW  Date Referred:  05/25/2012 Referred for  Other - See comment  SNF Placement   Other Referral:   SUPPORT DUE TO ILLNESS, Pt from SNF   Interview type:  Family Other interview type:   chart review, discussion with medical team.    PSYCHOSOCIAL DATA Living Status:  FACILITY Admitted from facility:  [facility] Level of care:  Skilled Nursing Facility Primary support name:  [name] Primary support relationship to patient:  CHILD, ADULT Degree of support available:   good from daughter and two sons    CURRENT CONCERNS Current Concerns  Adjustment to Illness  Post-Acute Placement   Other Concerns:   Pt's wife has also been ill and receives care at home from around the clock caregivers.    SOCIAL WORK ASSESSMENT / PLAN CSW met with Pt's daughter at length as she wanted to share the many stressors her family has experienced over the last few months. Per her report, both of her parents have had major health issues over the last few months that have resulted in several hospitalizations. She shared her father has been in much pain and depressed at [facility] where he has been since he had his hip replaced. She shared it had been hard to see him so miserable. She has been very happy with the SNF and plans to have him return if possible. Kim made several comments that made this CSW believe rehab at [facility] may not be what she ultimately decides for her father.   Assessment/plan status:  Psychosocial Support/Ongoing Assessment of Needs Other assessment/ plan:   assist with dispo   Information/referral to community resources:    PATIENT'S/FAMILY'S RESPONSE TO PLAN OF CARE: CSW provided support and active listening.  Family struggling with decisions and changes in condition. CSW will follow for support and assist with dispo needs as the evolve. Daughter appears accepting of her father's condition and appears to lean toward a comfort approach.    Doreen Salvage, LCSWA ICU/Stepdown Clinical Social Worker [facility] Cell [phone] Hours 8am-1200pm M-F

## 2012-05-26 NOTE — Significant Event (Signed)
Updated daughter (POA) at bedside.  Explained he is ready for extubation.  She is aware that he would not want long-term vent support, and has requested that he not be re-intubated in the event of recurrent respiratory failure.  She would be okay with BPAP as temporizing measure.  Will proceed with extubation.  Keep NG tube in, pending speech evaluation.  Coralyn Helling, MD [facility] Pulmonary/Critical Care 05/26/2012, 10:33 AM Pager:  [phone] After 3pm call: [phone]

## 2012-05-27 ENCOUNTER — Inpatient Hospital Stay (HOSPITAL_COMMUNITY): Payer: Medicare Other

## 2012-05-27 LAB — BASIC METABOLIC PANEL
CO2: 25 mEq/L (ref 19–32)
Calcium: 8.1 mg/dL — ABNORMAL LOW (ref 8.4–10.5)
Creatinine, Ser: 1.09 mg/dL (ref 0.50–1.35)
GFR calc Af Amer: 71 mL/min — ABNORMAL LOW (ref >90)
Sodium: 149 mEq/L — ABNORMAL HIGH (ref 135–145)

## 2012-05-27 LAB — GLUCOSE, CAPILLARY
Glucose-Capillary: 124 mg/dL — ABNORMAL HIGH (ref 70–99)
Glucose-Capillary: 95 mg/dL (ref 70–99)
Glucose-Capillary: 102 mg/dL — ABNORMAL HIGH (ref 70–99)
Glucose-Capillary: 96 mg/dL (ref 70–99)
Glucose-Capillary: 95 mg/dL (ref 70–99)

## 2012-05-27 LAB — CBC
MCV: 102.6 fL — ABNORMAL HIGH (ref 78.0–100.0)
Platelets: 106 10*3/uL — ABNORMAL LOW (ref 150–400)
RBC: 2.72 MIL/uL — ABNORMAL LOW (ref 4.22–5.81)
RDW: 16.3 % — ABNORMAL HIGH (ref 11.5–15.5)
WBC: 22.1 10*3/uL — ABNORMAL HIGH (ref 4.0–10.5)

## 2012-05-27 LAB — PROTIME-INR
INR: 2.43 — ABNORMAL HIGH (ref 0.00–1.49)
Prothrombin Time: 26.8 seconds — ABNORMAL HIGH (ref 11.6–15.2)

## 2012-05-27 MED ORDER — FREE WATER
200.0000 mL | Freq: Three times a day (TID) | Status: DC
  Administered 2012-05-27 – 2012-05-29 (×7): 200 mL

## 2012-05-27 MED ORDER — AMLODIPINE BESYLATE 2.5 MG PO TABS
2.5000 mg | ORAL_TABLET | Freq: Every day | ORAL | Status: DC
  Administered 2012-05-27 – 2012-05-31 (×4): 2.5 mg
  Filled 2012-05-27 (×5): qty 1

## 2012-05-27 NOTE — Progress Notes (Addendum)
ANTIBIOTIC CONSULT NOTE - FOLLOW UP  Pharmacy Consult for Vanco, Zosyn Indication: suspected HCAP vs aspiration PNA  No Known Allergies  Patient Measurements: Height: 6\' 2"  (188 cm) Weight: 200 lb 13.4 oz (91.1 kg) IBW/kg (Calculated) : 82.2   Vital Signs: Temp: 97.9 F (36.6 C) (09/07 0900) Temp src: Core (Comment) (09/07 0800) BP: 125/73 mmHg (09/07 0900) Pulse Rate: 54  (09/07 0900) Intake/Output from previous day: 09/06 0701 - 09/07 0700 In: 2808.3 [I.V.:1693.3; NG/GT:840; IV Piggyback:275] Out: 929 [Urine:925; Stool:4] Intake/Output from this shift: Total I/O In: 250 [I.V.:170; NG/GT:80] Out: 100 [Urine:100]  Labs:  Basename 05/27/12 0530 05/26/12 0355 05/25/12 0430  WBC 22.1* 21.7* 32.4*  HGB 9.4* 8.7* 10.0*  PLT 106* 89* 86*  LABCREA -- -- --  CREATININE 1.09 1.12 1.15   Estimated Creatinine Clearance: 60.7 ml/min (by C-G formula based on Cr of 1.09). No results found for this basename: VANCOTROUGH:2,VANCOPEAK:2,VANCORANDOM:2,GENTTROUGH:2,GENTPEAK:2,GENTRANDOM:2,TOBRATROUGH:2,TOBRAPEAK:2,TOBRARND:2,AMIKACINPEAK:2,AMIKACINTROU:2,AMIKACIN:2, in the last 72 hours   Assessment:  84 yom on Day #4 of Vancomycin and Zosyn for suspected HCAP vs aspiration PNA.  Afebrile, WBC better but still high at 20K.  Stable renal function  Blood cultures 9/4 NGTD.  MRSA pcr negative.  MD suggested further discussions regarding GOC.    CXR increase in perihilar and basilar atelectasis and small pleural effusions.  Goal of Therapy:  Vancomycin trough level 15-20 mcg/ml  Plan:   Given age, stable/elevated WBC, and no mentioning of narrowing antibiotics - will obtain a vancomycin trough prior to 1600 dose today.  Pharmacy will f/u  Geoffry Paradise Thi 05/27/2012,9:37 AM   -------------------------------------------------------------------------------------------- Addendum:   Labs: VT 18.6   A/P: Continue current dosing of vancomycin of 750mg  IV q12   Hessie Knows,  PharmD, BCPS Pager [phone] 05/27/2012 4:55 PM

## 2012-05-27 NOTE — Progress Notes (Signed)
Name: Ernest Kramer MRN: [id number] DOB: 1928/02/19    LOS: 3  Referring Provider:  EDP Reason for Referral:  Acute respiratory failure  PULMONARY / CRITICAL CARE MEDICINE  Brief patient description:  78 yo male admitted 06-01-12 from NH with septic shock, PNA, and VDRF. Hx of dementia, CLL.  Events Since Admission: 9/04 Sepsis protocol started 9/05 Off pressors  Current Status: No events overnight.   Vital Signs: Temp:  [97.9 F (36.6 C)-99.7 F (37.6 C)] 97.9 F (36.6 C) (09/07 0900) Pulse Rate:  [44-76] 54  (09/07 0900) Resp:  [10-21] 16  (09/07 0900) BP: (105-159)/(42-105) 125/73 mmHg (09/07 0900) SpO2:  [93 %-100 %] 98 % (09/07 0900) Weight:  [200 lb 13.4 oz (91.1 kg)] 200 lb 13.4 oz (91.1 kg) (09/07 0500)  Physical Examination: General: No distress Neuro: Somnolent, follows some commands, tremors improved HEENT: NG tube in place Neck:  Rt IJ site clean Cardiovascular: s1s2, no murmur Lungs: basilar rales, no wheeze Abdomen: soft, non-tender, +bowel sounds Musculoskeletal: no edema Skin:  No rash  Dg Chest Port 1 View  05/27/2012  *RADIOLOGY REPORT*  Clinical Data: Follow up pneumonia.  PORTABLE CHEST - 1 VIEW  Comparison: 05/26/2012.  Findings: The endotracheal tube has been removed.  The NG tube and right IJ catheters are stable.  Decreased lung volumes post extubation with increase and perihilar and basilar atelectasis. Suspect small effusions.  No pneumothorax.  IMPRESSION:  1.  Status post extubation with lower lung volumes and increase in perihilar and basilar atelectasis. 2.  Probable small effusions.   Original Report Authenticated By: P. Loralie Champagne, M.D.    Dg Chest Port 1 View  05/26/2012  *RADIOLOGY REPORT*  Clinical Data: Pneumonia  PORTABLE CHEST - 1 VIEW  Comparison: 05/25/2012  Findings: Endotracheal tube, NG tube, right internal jugular vein central venous catheter are stable.  Bibasilar pulmonary opacities left greater than right are stable.   Low lung volumes.  No pneumothorax.  IMPRESSION: Stable bibasilar opacities left greater than right.   Original Report Authenticated By: Donavan Burnet, M.D.     ASSESSMENT AND PLAN  PULMONARY  Lab 05/25/12 0334 2012/06/01 1300 06-01-2012 0310  PHART 7.463* -- 7.366  PCO2ART 28.5* -- 38.0  PO2ART 74.0* -- 90.9  HCO3 20.5 -- 21.2  O2SAT 98.0 85.1 96.7   ETT:  9/4 >>>9/06  A:  Acute respiratory failure 2nd to PNA. P:   Titrate oxygen to keep SpO2 > 92% F/u CXR intermittently PRN BD's  CARDIOVASCULAR  Lab 06-01-2012 1510 2012-06-01 1010 06-01-12 1000 06/01/12 0405 Jun 01, 2012 0200  TROPONINI <0.30 <0.30 -- <0.30 --  LATICACIDVEN 4.1* -- 5.8* -- 2.1  PROBNP -- -- -- -- 1248.0*   Lines: R IJ CVL 9/4 >>>  A: Septic shock 2nd to PNA>>resolved 9/05.  Hx of Atrial fibrillation / controlled rate, CAD, Dyslipidemia, PE/DVT on anticoagulation. P:  D/c solucortef Continue ASA, simvastatin, lopressor Resume norvasc Will need to determine if he is suitable candidate to resume coumadin>>probably not  RENAL  Lab 05/27/12 0530 05/26/12 0355 05/25/12 0430 06-01-12 1115 Jun 01, 2012 0200  NA 149* 148* 144 144 147*  K 3.3* 3.3* -- -- --  CL 118* 117* 114* 112 110  CO2 25 23 23 20 27   BUN 30* 25* 23 23 23   CREATININE 1.09 1.12 1.15 1.17 1.06  CALCIUM 8.1* 7.8* 8.0* 8.2* 8.8  MG -- -- 1.8 1.7 --  PHOS -- -- -- -- --   Intake/Output      09/06  4098 - 09/07 0700 09/07 0701 - 09/08 0700   I.V. (mL/kg) 1693.3 (18.6) 170 (1.9)   Other     NG/GT 840 80   IV Piggyback 275    Total Intake(mL/kg) 2808.3 (30.8) 250 (2.7)   Urine (mL/kg/hr) 925 (0.4) 100   Stool 4    Total Output 929 100   Net +1879.3 +150         Foley:  9/4 >>>  A: Hypernatremia. P:   F/u BMET Monitor electrolytes, urine outpt Add free water flushes  GASTROINTESTINAL  Lab 05/25/12 0430 06/09/2012 1115 05/27/2012 0200  AST 13 15 18   ALT 14 16 17   ALKPHOS 150* 179* 187*  BILITOT 0.7 0.8 0.9  PROT 4.2* 4.5* 4.8*  ALBUMIN  1.9* 2.0* 2.3*   A: Protein calorie malnutrition. P:   Started oxepa 9/05 Speech to assess swallowing  HEMATOLOGIC  Lab 05/27/12 0530 05/26/12 0355 05/25/12 0430 06/01/2012 1010 06/08/2012 0200  HGB 9.4* 8.7* 10.0* -- 12.5*  HCT 27.9* 25.6* 29.1* -- 36.8*  PLT 106* 89* 86* -- 61*  INR 2.43* 3.82* 4.11* 3.01* 2.64*  APTT -- -- 81* -- 83*   A:  Coagulopathy (Coumadin induced).   Thrombocytopenia.   Hx of chronic lymphocytic leukemia. P:  F/u CBC intermittently F/u INR>>resume coumadin when INR < 2  INFECTIOUS  Lab 05/27/12 0530 05/26/12 0355 05/25/12 0430 06/05/2012 1115 06/15/2012 0200  WBC 22.1* 21.7* 32.4* -- 20.7*  PROCALCITON -- -- -- 0.12 0.11   Cultures: 9/4  Blood >>  Antibiotics: Zosyn 9/4 >>> Vancomycin 9/4 >>>  A:  Suspected HCAP vs aspiration. P:   D4/x zosyn, vancomycin  ENDOCRINE  Lab 05/27/12 0748 05/27/12 0343 05/26/12 2355 05/26/12 1958 05/26/12 1603  GLUCAP 102* 95 124* 106* 116*   A:  Hyperglycemia  P:   SSI  NEUROLOGIC  A:  Acute encephalopathy 2nd to sepsis, PNA, hypoxia. Tremor. Hx of dementia. Back pain. P:   Continue aricept PRN fentanyl  BEST PRACTICE / DISPOSITION Level of Care:  Transfer to medical floor 9/07 Primary Service:  PCCM Consultants:  None Code Status:  DNR/DNI Diet:  Tube feeds DVT Px: chronic anti-coaguation with coumadin GI Px: protonix Skin Integrity:  Intact Social / Family:  Updated family 9/06  Keep CVL and foley in for now.  Will need to have further discussions about goals of care given progressive nature of dementia.  Coralyn Helling, MD [facility] Pulmonary/Critical Care 05/27/2012, 9:10 AM Pager:  [phone] After 3pm call: [phone]

## 2012-05-27 NOTE — Evaluation (Signed)
Clinical/Bedside Swallow Evaluation Patient Details  Name: Ernest Kramer MRN: [id number] Date of Birth: 1928/03/18  Today's Date: 05/27/2012 Time: 1115-     Past Medical History:  Past Medical History  Diagnosis Date  . Lymphocytosis 10/06    absolute  . Vertigo     chronic   . Lymphocytic leukemia     chronic   . H/O: GI bleed     secondary to jejunal lesions   . HTN (hypertension)   . Dyslipidemia   . DVT (deep vein thrombosis) in pregnancy     hx  . PE (pulmonary embolism)     hx   . Hx-TIA (transient ischemic attack)   . Migraine     hx    Past Surgical History:  Past Surgical History  Procedure Date  . Carotid doppler 01/18/01    nl; echo - mild MR, TR. EF wnl 01/18/01, lens implants, bil - 07/08/05, MRI with MRA - L ; frontal CVA 01/18/01  . Appendectomy   . R inguinal hernia repair    HPI:   78 yo male admitted 05/23/2012 from NH with septic shock, PNA, and VDRF. Note PMH of leukemia, dementia, and recently diagnosed PNA.    Assessment / Plan / Recommendation Clinical Impression  Exam limited due to decreased mentation. Patient able to orally accept bolus with moderate tactile clinician cueing however oral response to bolus inconsistent with frequent oral holding, anterior labial spillage, decreased awareness, followed by suspected delayed swallow initiation, mutliple swallows, and intermittent wet vocal quality and throat clearing indicative of penetration and/or aspiration of bolus. At this time, mentation does not allow for safe po trials beyond ice chips (oral care complete prior by clinician). Will continue to f/u at bedside for readiness for further diagnostic po trials. Suspect that if improves mentally, MBS may be beneficial to determine least restrictive diet.     Aspiration Risk  Severe    Diet Recommendation NPO   Medication Administration: Via alternative means    Other  Recommendations Oral Care Recommendations: Oral care QID   Follow Up  Recommendations  Skilled Nursing facility    Frequency and Duration min 3x week  2 weeks   Pertinent Vitals/Pain n/a    SLP Swallow Goals Goal #3: Patient will orally manipulate bolus and initiate swallow with clinician provided po trials with min assist to differentially diagnose dysphagia at bedside.  Swallow Study Goal #3 - Progress: Not met   Swallow Study    General HPI:  78 yo male admitted 06/01/2012 from NH with septic shock, PNA, and VDRF. Note PMH of leukemia, dementia, and recently diagnosed PNA.  Type of Study: Bedside swallow evaluation Diet Prior to this Study: NPO;IV (NG tube) Temperature Spikes Noted: No Respiratory Status: Supplemental O2 delivered via (comment) (2 L nasal cannula) History of Recent Intubation: Yes Length of Intubations (days): 2 days Date extubated: 05/26/12 Behavior/Cognition: Confused;Lethargic;Distractible;Requires cueing;Doesn't follow directions;Decreased sustained attention Oral Cavity - Dentition: Adequate natural dentition (adequate top dentition, missing bottom teeth) Self-Feeding Abilities: Total assist Patient Positioning: Upright in bed Baseline Vocal Quality: Hoarse;Low vocal intensity Volitional Cough: Cognitively unable to elicit Volitional Swallow: Unable to elicit    Oral/Motor/Sensory Function Overall Oral Motor/Sensory Function:  (unable to formally assess due to decreased mentation)   Ice Chips Ice chips: Impaired Presentation: Spoon Oral Phase Impairments: Reduced labial seal;Reduced lingual movement/coordination;Impaired anterior to posterior transit;Poor awareness of bolus Oral Phase Functional Implications: Prolonged oral transit;Oral holding;Oral residue Pharyngeal Phase Impairments: Suspected delayed Swallow;Decreased hyoid-laryngeal movement;Wet  Vocal Quality;Throat Clearing - Immediate   Thin Liquid Thin Liquid: Impaired Presentation: Spoon Oral Phase Impairments: Reduced labial seal;Reduced lingual  movement/coordination;Impaired anterior to posterior transit;Poor awareness of bolus Oral Phase Functional Implications: Right anterior spillage;Prolonged oral transit;Oral residue;Oral holding Pharyngeal  Phase Impairments: Suspected delayed Swallow;Decreased hyoid-laryngeal movement;Multiple swallows    Nectar Thick Nectar Thick Liquid: Not tested   Honey Thick Honey Thick Liquid: Not tested   Puree Puree: Not tested   Solid   GO   Jennalynn Rivard MA, CCC-SLP [phone]  Solid: Not tested       Jymir Dunaj Meryl 05/27/2012,11:34 AM

## 2012-05-27 NOTE — Progress Notes (Signed)
Patient transferring to room 1343.  Report called to [place], [place].  Patient will travel by bed.  Will continue to monitor.

## 2012-05-28 DIAGNOSIS — E876 Hypokalemia: Secondary | ICD-10-CM | POA: Diagnosis present

## 2012-05-28 DIAGNOSIS — Z87898 Personal history of other specified conditions: Secondary | ICD-10-CM

## 2012-05-28 LAB — BASIC METABOLIC PANEL
BUN: 30 mg/dL — ABNORMAL HIGH (ref 6–23)
Calcium: 8.3 mg/dL — ABNORMAL LOW (ref 8.4–10.5)
Creatinine, Ser: 0.9 mg/dL (ref 0.50–1.35)
GFR calc Af Amer: 89 mL/min — ABNORMAL LOW (ref >90)
GFR calc non Af Amer: 77 mL/min — ABNORMAL LOW (ref >90)

## 2012-05-28 LAB — CBC
MCHC: 33.3 g/dL (ref 30.0–36.0)
Platelets: 128 10*3/uL — ABNORMAL LOW (ref 150–400)
RDW: 16 % — ABNORMAL HIGH (ref 11.5–15.5)

## 2012-05-28 LAB — GLUCOSE, CAPILLARY: Glucose-Capillary: 80 mg/dL (ref 70–99)

## 2012-05-28 LAB — PROTIME-INR: Prothrombin Time: 23 seconds — ABNORMAL HIGH (ref 11.6–15.2)

## 2012-05-28 MED ORDER — POTASSIUM CHLORIDE 20 MEQ/15ML (10%) PO LIQD
40.0000 meq | Freq: Every day | ORAL | Status: AC
  Administered 2012-05-28 – 2012-05-30 (×3): 40 meq
  Filled 2012-05-28 (×3): qty 30

## 2012-05-28 NOTE — Progress Notes (Signed)
Name: KYLAR LEONHARDT MRN: [id number] DOB: [birth year]    LOS: 4  Referring Provider:  EDP Reason for Referral:  Acute respiratory failure  PULMONARY / CRITICAL CARE MEDICINE  Brief patient description:  78 yo male admitted 06/08/2012 from NH with septic shock, PNA, and VDRF. Hx of dementia, CLL.  Events Since Admission: 9/04 Sepsis protocol started 9/05 Off pressors  Current Status: Nurse reported hypotensive- 117/ 47. Norvasc held this morning.    Vital Signs: Temp:  [95.9 F (35.5 C)] 95.9 F (35.5 C) (09/07 2219) Pulse Rate:  [56-65] 64  (09/08 0937) Resp:  [16-20] 20  (09/08 0937) BP: (117-139)/(47-62) 117/47 mmHg (09/08 0937) SpO2:  [95 %-98 %] 95 % (09/08 0516)  Physical Examination: General: No distress Neuro: Somnolent, alerts and looks to voice but no interactive communication,  Protective mittens.  HEENT: NG tube in place Neck:  Rt IJ site clean Cardiovascular: s1s2, no murmur Lungs: basilar rales, no wheeze Abdomen: soft, non-tender, borborygmi active Musculoskeletal: no edema Skin:  No rash  Dg Chest Port 1 View  05/27/2012  *RADIOLOGY REPORT*  Clinical Data: Follow up pneumonia.  PORTABLE CHEST - 1 VIEW  Comparison: 05/26/2012.  Findings: The endotracheal tube has been removed.  The NG tube and right IJ catheters are stable.  Decreased lung volumes post extubation with increase and perihilar and basilar atelectasis. Suspect small effusions.  No pneumothorax.  IMPRESSION:  1.  Status post extubation with lower lung volumes and increase in perihilar and basilar atelectasis. 2.  Probable small effusions.   Original Report Authenticated By: P. Loralie Champagne, M.D.     ASSESSMENT AND PLAN  PULMONARY  Lab 05/25/12 0334 05/30/2012 1300 06/05/2012 0310  PHART 7.463* -- 7.366  PCO2ART 28.5* -- 38.0  PO2ART 74.0* -- 90.9  HCO3 20.5 -- 21.2  O2SAT 98.0 85.1 96.7   ETT:  9/4 >>>9/06  A:  Acute respiratory failure 2nd to PNA. P:   Titrate oxygen to keep SpO2 >  92% F/u CXR intermittently> For CXR 9/9 PRN BD's  CARDIOVASCULAR  Lab 05/26/2012 1510 06/18/2012 1010 06/09/2012 1000 05/23/2012 0405 05/22/2012 0200  TROPONINI <0.30 <0.30 -- <0.30 --  LATICACIDVEN 4.1* -- 5.8* -- 2.1  PROBNP -- -- -- -- 1248.0*   Lines: R IJ CVL 9/4 >>>  A: Septic shock 2nd to PNA>>resolved 9/05.  Hx of Atrial fibrillation / controlled rate, CAD, Dyslipidemia, PE/DVT on anticoagulation. P:  D/c solucortef Continue ASA, simvastatin, lopressor Resume norvasc> held 9/8 due to hypotension Will need to determine if he is suitable candidate to resume coumadin>>probably not. He is a very poor candidate for chronic anticoagulation.  RENAL  Lab 05/28/12 0529 05/27/12 0530 05/26/12 0355 05/25/12 0430 06/07/2012 1115  NA 152* 149* 148* 144 144  K 2.8* 3.3* -- -- --  CL 121* 118* 117* 114* 112  CO2 27 25 23 23 20   BUN 30* 30* 25* 23 23  CREATININE 0.90 1.09 1.12 1.15 1.17  CALCIUM 8.3* 8.1* 7.8* 8.0* 8.2*  MG -- -- -- 1.8 1.7  PHOS -- -- -- -- --   Intake/Output      09/07 0701 - 09/08 0700 09/08 0701 - 09/09 0700   I.V. (mL/kg) 230 (2.5)    NG/GT 440 40   IV Piggyback     Total Intake(mL/kg) 670 (7.4) 40 (0.4)   Urine (mL/kg/hr) 875 (0.4)    Stool 4    Total Output 879    Net -209 +40  Foley:  9/4 >>>  A: Hypernatremia. P:   F/u BMET Monitor electrolytes, urine outpt Add free water flushes  A: Hypokalemia P- add K to tube feed  GASTROINTESTINAL  Lab 05/25/12 0430 05/26/2012 1115 06/06/2012 0200  AST 13 15 18   ALT 14 16 17   ALKPHOS 150* 179* 187*  BILITOT 0.7 0.8 0.9  PROT 4.2* 4.5* 4.8*  ALBUMIN 1.9* 2.0* 2.3*   A: Protein calorie malnutrition. P:   Started oxepa 9/05 Speech to assess swallowing  HEMATOLOGIC  Lab 05/28/12 0529 05/27/12 0530 05/26/12 0355 05/25/12 0430 06/07/2012 1010 05/26/2012 0200  HGB 9.3* 9.4* 8.7* 10.0* -- 12.5*  HCT 27.9* 27.9* 25.6* 29.1* -- 36.8*  PLT 128* 106* 89* 86* -- 61*  INR 2.00* 2.43* 3.82* 4.11* 3.01* --  APTT  -- -- -- 81* -- 83*   A:  Coagulopathy (Coumadin induced).   Thrombocytopenia.   Hx of chronic lymphocytic leukemia. P:  F/u CBC intermittently F/u INR>>resume coumadin when INR < 2  INFECTIOUS  Lab 05/28/12 0529 05/27/12 0530 05/26/12 0355 05/25/12 0430 05/30/2012 1115 06/02/2012 0200  WBC 26.1* 22.1* 21.7* 32.4* -- 20.7*  PROCALCITON -- -- -- -- 0.12 0.11   Cultures: 9/4  Blood >>  Antibiotics: Zosyn 9/4 >>> Vancomycin 9/4 >>>  A:  Suspected HCAP vs aspiration. P:   D4/x zosyn, vancomycin  ENDOCRINE  Lab 05/28/12 1157 05/28/12 0832 05/28/12 0412 05/27/12 2355 05/27/12 1934  GLUCAP 80 80 94 91 95   A:  Hyperglycemia  P:   SSI  NEUROLOGIC  A:  Acute encephalopathy 2nd to sepsis, PNA, hypoxia. Tremor. Hx of dementia. Back pain. P:   Continue aricept PRN fentanyl  BEST PRACTICE / DISPOSITION Level of Care:  Transfer to medical floor 9/07 Primary Service:  PCCM Consultants:  None Code Status:  DNR/DNI Diet:  Tube feeds DVT Px: chronic anti-coaguation with coumadin GI Px: protonix Skin Integrity:  Intact Social / Family:  Updated family 9/06  Keep CVL and foley in for now.  Will need to have further discussions about goals of care given progressive nature of dementia.  CD Maple Hudson, MD PCCM

## 2012-05-28 NOTE — Progress Notes (Signed)
BP 117/47, HR= 64. Dr. Maple Hudson notified, new orders received. Held norvasc. Continue to assess and monitor

## 2012-05-29 ENCOUNTER — Inpatient Hospital Stay (HOSPITAL_COMMUNITY): Payer: Medicare Other

## 2012-05-29 DIAGNOSIS — I82409 Acute embolism and thrombosis of unspecified deep veins of unspecified lower extremity: Secondary | ICD-10-CM

## 2012-05-29 LAB — BASIC METABOLIC PANEL
BUN: 28 mg/dL — ABNORMAL HIGH (ref 6–23)
Chloride: 121 mEq/L — ABNORMAL HIGH (ref 96–112)
Creatinine, Ser: 0.92 mg/dL (ref 0.50–1.35)
GFR calc Af Amer: 89 mL/min — ABNORMAL LOW (ref >90)

## 2012-05-29 LAB — GLUCOSE, CAPILLARY
Glucose-Capillary: 121 mg/dL — ABNORMAL HIGH (ref 70–99)
Glucose-Capillary: 85 mg/dL (ref 70–99)
Glucose-Capillary: 93 mg/dL (ref 70–99)

## 2012-05-29 LAB — PROTIME-INR: Prothrombin Time: 21.2 seconds — ABNORMAL HIGH (ref 11.6–15.2)

## 2012-05-29 MED ORDER — OSMOLITE 1.2 CAL PO LIQD
1000.0000 mL | ORAL | Status: DC
  Administered 2012-05-29 – 2012-05-30 (×2): 1000 mL

## 2012-05-29 MED ORDER — PRO-STAT SUGAR FREE PO LIQD
30.0000 mL | Freq: Two times a day (BID) | ORAL | Status: DC
  Administered 2012-05-29 – 2012-05-31 (×6): 30 mL
  Filled 2012-05-29 (×7): qty 30

## 2012-05-29 MED ORDER — FREE WATER: 200.0000 mL | Freq: Four times a day (QID) | Status: DC

## 2012-05-29 MED ORDER — FREE WATER
250.0000 mL | Status: DC
  Administered 2012-05-29 – 2012-05-31 (×14): 250 mL

## 2012-05-29 MED ORDER — POTASSIUM CHLORIDE 20 MEQ/15ML (10%) PO LIQD
40.0000 meq | Freq: Once | ORAL | Status: AC
  Administered 2012-05-29: 40 meq via ORAL
  Filled 2012-05-29: qty 30

## 2012-05-29 MED ORDER — OSMOLITE 1.2 CAL PO LIQD: 1000.0000 mL | ORAL | Status: DC

## 2012-05-29 NOTE — Progress Notes (Addendum)
Nutrition Follow-up  Intervention: Change TF to Osmolite 1.2 via NGT start at 42ml/hr increase by 10ml q4hr to goal of 54ml/hr. This will provide 1872 calories, 86g protein, and free water. Will order Prostat 30ml BID via NGT to provide an additional 30g protein, 200 calories. Will change water flushes to water flushes q6hr. Will monitor for TF tolerance.   Diet Order: NPO  TF: Oxepa at 37ml/hr - provides 1440 calories, 60g protein, free water, meets 83% of estimated calorie needs and 60% estimated protein needs   - Pt extubated 9/6. Pt with SLP bedside swallow evaluation on 9/7 and noted pt with severe aspiration risk with recommendations for NPO. Pt with dementia. RN reports no TF residuals this morning. Discussed TF with critical care NP who stated RD could change formula to Osmolite 1.2. Noted pt with unstageable sacral pressure ulcer, stage 2 left sacral pressure ulcer, stage 2 left, lower, lateral pressure ulcer, and stage 2 right facial pressure ulcer. Pt asleep with protective mittens on. Noted possible plans for goals of care meeting.   Meds: Scheduled Meds:   . amLODipine  2.5 mg Per Tube Daily  . antiseptic oral rinse  1 application Mouth Rinse QID  . aspirin  81 mg Oral Daily  . chlorhexidine  15 mL Mouth/Throat BID  . Chlorhexidine Gluconate Cloth  6 each Topical Q0600  . donepezil  10 mg Oral Daily  . free water  200 mL Per Tube Q8H  . insulin aspart  0-15 Units Subcutaneous Q4H  . metoprolol tartrate  12.5 mg Per Tube Q12H  . mupirocin ointment  1 application Nasal BID  . pantoprazole sodium  40 mg Per Tube Q24H  . piperacillin-tazobactam (ZOSYN)  IV  3.375 g Intravenous Q8H  . potassium chloride  40 mEq Per Tube Daily  . vancomycin  750 mg Intravenous Q12H   Continuous Infusions:   . dextrose    . feeding supplement (OXEPA) 1,000 mL (05/28/12 2045)   PRN Meds:.albuterol-ipratropium, dextrose, fentaNYL  Labs:  CMP     Component Value Date/Time   NA 154* 05/29/2012 0549   NA 147* 12/31/2008 1055   K 3.0* 05/29/2012 0549   K 3.5 12/31/2008 1055   CL 121* 05/29/2012 0549   CL 102 12/31/2008 1055   CO2 27 05/29/2012 0549   CO2 29 12/31/2008 1055   GLUCOSE 114* 05/29/2012 0549   GLUCOSE 99 12/31/2008 1055   BUN 28* 05/29/2012 0549   BUN 15 12/31/2008 1055   CREATININE 0.92 05/29/2012 0549   CREATININE 1.4* 12/31/2008 1055   CALCIUM 8.0* 05/29/2012 0549   CALCIUM 8.8 12/31/2008 1055   PROT 4.2* 05/25/2012 0430   PROT 6.2* 12/31/2008 1055   ALBUMIN 1.9* 05/25/2012 0430   AST 13 05/25/2012 0430   AST 35 12/31/2008 1055   ALT 14 05/25/2012 0430   ALKPHOS 150* 05/25/2012 0430   ALKPHOS 71 12/31/2008 1055   BILITOT 0.7 05/25/2012 0430   BILITOT 2.00* 12/31/2008 1055   GFRNONAA 76* 05/29/2012 0549   GFRAA 89* 05/29/2012 0549     Intake/Output Summary (Last 24 hours) at 05/29/12 0951 Last data filed at 05/29/12 0524  Gross per 24 hour  Intake    210 ml  Output   1252 ml  Net  -1042 ml   Last BM - 9/9  Weight Status:   9/4 180 lb 1.9 oz 9/9 203 lb 11.3 oz  Re-estimated needs:  2300-2700 calories 110-135g protein  Nutrition Dx: Inadequate oral intake -  ongoing  Goal: TF to meet 100% of estimated needs - not met   Monitor: TF tolerance and advancement, weights, labs, BM, diet advancement, goals of care  Levon Hedger MS, RD, LDN [phone] Pager [phone] After Hours Pager

## 2012-05-29 NOTE — Progress Notes (Signed)
Name: ARSHAWN VALDEZ MRN: [id number] DOB: 15-Mar-1928    LOS: 5  Referring Provider:  EDP Reason for Referral:  Acute respiratory failure  PULMONARY / CRITICAL CARE MEDICINE  Brief patient description:  78 yo male admitted 06/16/2012 from NH with septic shock, PNA, and VDRF. Hx of dementia, CLL.  Hospital course Admitted to the ICU w/ respiratory failure and septic shock. Treated w/ broad spectrum antibiotics, aggressive volume resuscitation efforts, and pressor support. Eventually weaned off pressors. Was extubated with the support of family on 9/6. He is full DNR/DNI status. Moved to the medical ward on 9/7. At time of transfer he remains encephalopathic compared to baseline.  ETT:  9/4 >>>9/06 Lines: R IJ CVL 9/4 >>>  Cultures: 9/4  Blood >>  Antibiotics: Zosyn 9/4 >>> Vancomycin 9/4 >>> Events Since Admission: 9/04 Sepsis protocol started 9/05 Off pressors  Current Status: Nurse reported hypotensive- 117/ 47. Norvasc held this morning.    Vital Signs: Temp:  [97.7 F (36.5 C)-98.3 F (36.8 C)] 98 F (36.7 C) (09/09 0521) Pulse Rate:  [59-71] 68  (09/09 0521) Resp:  [20] 20  (09/09 0521) BP: (119-154)/(49-105) 119/65 mmHg (09/09 0521) SpO2:  [95 %-99 %] 95 % (09/09 0521) Weight:  [92.4 kg (203 lb 11.3 oz)] 92.4 kg (203 lb 11.3 oz) (09/09 0521) 2 liters  Physical Examination: General: No distress, not verbal Neuro: Somnolent, alerts and looks to voice but no interactive communication,  Protective mittens.  HEENT: NG tube in place Neck:  Rt IJ site clean Cardiovascular: s1s2, no murmur Lungs: basilar rales, and diffuse rhonchi  Abdomen: soft, non-tender, borborygmi active Musculoskeletal: no edema Skin:  No rash  Lab 05/29/12 0549 05/28/12 0529 05/27/12 0530  NA 154* 152* 149*  K 3.0* 2.8* 3.3*  CL 121* 121* 118*  CO2 27 27 25   BUN 28* 30* 30*  CREATININE 0.92 0.90 1.09  GLUCOSE 114* 99 123*    Lab 05/28/12 0529 05/27/12 0530 05/26/12 0355  HGB 9.3*  9.4* 8.7*  HCT 27.9* 27.9* 25.6*  WBC 26.1* 22.1* 21.7*  PLT 128* 106* 89*    Dg Chest Port 1 View  05/29/2012  *RADIOLOGY REPORT*  Clinical Data: Pneumonia and respiratory failure.  PORTABLE CHEST - 1 VIEW  Comparison: 05/27/2012  Findings: Central line and nasogastric tube positioning are stable. Lungs show interval worsening of airspace disease in the left lower lung and slight worsening of airspace disease in the right upper lung.  There is persistent associated pulmonary vascular congestion.  No large pleural effusions are identified.  IMPRESSION: Worsening bilateral pneumonia.   Original Report Authenticated By: Reola Calkins, M.D.     ASSESSMENT AND PLAN   1) Acute respiratory failure 2nd to PNA. Suspected HCAP vs aspiration (culture negative).  Was on vent from 9/4 to 9/6  Respiratory status still tenuous. He is full DNR and DNI. Will need to decide on comfort feeds at some point.  Rec:  Keep NPO Wean FIO2 Aspiration precautions.  Complete 8d empiric antibiotic coverage for aspiration NOS now day 5/8  2) Septic shock 2nd to PNA>>resolved 9/05. Treated in usual fashion w/ EGDT protocol, including aggressive hydration and vasoactive support.   3) Hx of Atrial fibrillation / controlled rate, CAD, Dyslipidemia, PE/DVT on anticoagulation.  Lab Results  Component Value Date   INR 1.80* 05/29/2012   INR 2.00* 05/28/2012   INR 2.43* 05/27/2012   PROTIME 21.6* 01/15/2008  plan: Cont CCB Continue ASA, simvastatin, lopressor Will need to determine if he  is suitable candidate to resume coumadin>>probably not. He is a very poor candidate for chronic anticoagulation.  4) Hypernatremia. Continues to worsen.   Lab 05/29/12 0549 05/28/12 0529 05/27/12 0530  NA 154* 152* 149*  Plan: -adjust free water   Hypokalemia  Lab 05/29/12 0549 05/28/12 0529 05/27/12 0530  K 3.0* 2.8* 3.3*  plan: Replace and recheck  Protein calorie malnutrition. Started oxepa 9/05 Plan: -per nutrition  support.   Thrombocytopenia.  A little better   Lab 05/28/12 0529 05/27/12 0530 05/26/12 0355  PLT 128* 106* 89*   Hx of chronic lymphocytic leukemia. Not an acute issue   Hyperglycemia excellent control  CBG (last 3)   Basename 05/29/12 0800 05/29/12 0437 05/29/12 0005  GLUCAP 95 89 85  plan ssi   Acute encephalopathy 2nd to sepsis, PNA, hypoxia, superimposed on Hx of dementia Tremor and Back pain. Hope that w/ Na improved his MS will follow some.  P:   Continue aricept Correct lyte imbalances   Keep CVL and foley in for now.  Will transfer to [facility] service, PCCM signing off, please call back if needed.  Patient seen and examined, agree with above note.  I dictated the care and orders written for this patient under my direction.  Koren Bound, M.D. [phone]

## 2012-05-30 LAB — GLUCOSE, CAPILLARY
Glucose-Capillary: 126 mg/dL — ABNORMAL HIGH (ref 70–99)
Glucose-Capillary: 129 mg/dL — ABNORMAL HIGH (ref 70–99)
Glucose-Capillary: 130 mg/dL — ABNORMAL HIGH (ref 70–99)
Glucose-Capillary: 88 mg/dL (ref 70–99)

## 2012-05-30 LAB — PROTIME-INR
INR: 1.55 — ABNORMAL HIGH (ref 0.00–1.49)
Prothrombin Time: 18.9 seconds — ABNORMAL HIGH (ref 11.6–15.2)

## 2012-05-30 LAB — CULTURE, BLOOD (ROUTINE X 2): Culture: NO GROWTH

## 2012-05-30 MED ORDER — ATROPINE SULFATE 1 % OP SOLN: 2.0000 [drp] | OPHTHALMIC | Status: DC | PRN

## 2012-05-30 NOTE — Progress Notes (Signed)
Oldest son Montez Hageman Christen Bame) in room visiting. Left his cell # [phone] if any needs arise. JR states that his mother/patients wife has HC POA. Do not know accuracy of this. I did inform JR that medical staff here on the unit would not be able to "take sides" w/any issues regarding legal boundaries between he & his brother & sister. Voiced understanding.Hartley Barefoot

## 2012-05-30 NOTE — Progress Notes (Addendum)
[facility] PROGRESS NOTE  Ernest Kramer ZOX:[id number] DOB: [birth year] DOA: 2012/05/29 PCP: No primary provider on file.  Brief narrative: 78 year old male with history of CLL, dementia who was initially admitted to ICU 05-29-12 for acute hypoxic respiratory failure requiring intubation and mechanical ventilation and septic shock. Patient was treated with broad spectrum antibiotics, aggressive volume resuscitation efforts and pressors. Eventually weaned off pressors, extubated once made DNR/DNI. Patient transferred to [facility] service 05/30/2012. I have discussed goals of care with the patient's daughter Ernest Kramer and have explained to her that Ernest Kramer medical condition will likely not improve with current regimen which includes but is not limited to antibiotics. I have also explained to her that Ernest Kramer is a poor candidate for long term anticoagulation and perhaps at this time we should direct our efforts at making him comfortable to the extent possible. She has agreed with me but asked if we can continue current medication until she has a chance to discuss this with her brother who can then assist her in decision making process in regards to her father's treatment.   ASSESSMENT AND PLAN   Principal Problem: *Acute respiratory failure 2nd to PNA. Suspected HCAP vs aspiration (culture negative).  - patient required mechanical ventilation from 9/4 til 9/8 - respiratory status progressively worse since yesterday per RN reports - Waiting for family to decide on goals of care - for now we will continue current regimen with IV antibiotics, vanco and zosyn - combivent PRN  Active Problems: History of Atrial fibrillation, PE and DVT - rate controlled - per pulmonary, pt is a poor candidate for long term anticoagulation - will continue rate control with lopressor - continue aspirin  Hypernatremia.  - continues to worsen - continue to adjust free water   Hypokalemia  - repleted -  check BMP in am  Protein calorie malnutrition.  - continue  osmolyte per tube feeds  Thrombocytopenia.  - improving  Hx of chronic lymphocytic leukemia.  - Not an acute issue   Code Status: DNR Family Communication: updated daughter over the phone Disposition Plan: pending d/c plan at this time  Ernest Passey, MD  [facility] Pager [phone]  If 7PM-7AM, please contact night-coverage [email or web address] Password [email or web address] 05/30/2012, 4:53 PM   LOS: 6 days   HPI/Subjective: Looks lethargic, minimally responsive to verbal stimuli.  Objective: Filed Vitals:   05/30/12 0600 05/30/12 1103 05/30/12 1110 05/30/12 1329  BP: 117/58 138/119 137/48 119/93  Pulse: 68   65  Temp: 97.6 F (36.4 C)   97.6 F (36.4 C)  TempSrc: Axillary   Axillary  Resp: 20   18  Height:      Weight:      SpO2: 90%   94%    Intake/Output Summary (Last 24 hours) at 05/30/12 1653 Last data filed at 05/30/12 1400  Gross per 24 hour  Intake   2160 ml  Output   1220 ml  Net    940 ml    Exam:   General:  Pt is lethargic, minimally responsive to verbal stimuli  Cardiovascular: Regular rate and rhythm, S1/S2, murmur appreciated   Respiratory: congested, gargling sounds audible  Abdomen: Soft, non tender, non distended, bowel sounds present, no guarding  Extremities: pulses DP and PT palpable bilaterally  Neuro: Grossly nonfocal  Data Reviewed: Basic Metabolic Panel:  Lab 05/29/12 8295 05/28/12 0529 05/27/12 0530 05/26/12 0355 05/25/12 0430  NA 154* 152* 149* 148* 144  K 3.0* 2.8* 3.3* 3.3* 3.5  CL  121* 121* 118* 117* 114*  CO2 27 27 25 23 23   GLUCOSE 114* 99 123* 113* 138*  BUN 28* 30* 30* 25* 23  CREATININE 0.92 0.90 1.09 1.12 1.15  CALCIUM 8.0* 8.3* 8.1* 7.8* 8.0*   Liver Function Tests:  Lab 05/25/12 0430 May 29, 2012 1115 May 29, 2012 0200  AST 13 15 18   ALT 14 16 17   ALKPHOS 150* 179* 187*  BILITOT 0.7 0.8 0.9  PROT 4.2* 4.5* 4.8*  ALBUMIN 1.9* 2.0* 2.3*   CBC:  Lab 05/28/12 0529 05/27/12  0530 05/26/12 0355 05/25/12 0430 05/29/2012 0200  WBC 26.1* 22.1* 21.7* 32.4* 20.7*  HGB 9.3* 9.4* 8.7* 10.0* 12.5*  HCT 27.9* 27.9* 25.6* 29.1* 36.8*  MCV 102.6* 102.6* 100.4* 99.7 100.8*  PLT 128* 106* 89* 86* 61*   Cardiac Enzymes:  Lab 05/29/2012 1510 May 29, 2012 1010 2012/05/29 0405  CKTOTAL -- -- --  CKMB -- -- --  CKMBINDEX -- -- --  TROPONINI <0.30 <0.30 <0.30   CBG:  Lab 05/30/12 1540 05/30/12 1151 05/30/12 0738 05/30/12 0408 05/30/12 0029  GLUCAP 88 126* 130* 120* 129*    CULTURE, BLOOD (ROUTINE X 2)     Status: Normal   Collection Time   May 29, 2012  2:00 AM      Component Value Range Status Comment   Culture NO GROWTH 5 DAYS   Final    Report Status 05/30/2012 FINAL   Final   CULTURE, BLOOD (ROUTINE X 2)     Status: Normal   Collection Time   2012/05/29  2:32 AM      Component Value Range Status Comment   Culture NO GROWTH 5 DAYS   Final    Report Status 05/30/2012 FINAL   Final   MRSA PCR SCREENING     Status: Abnormal   Collection Time   May 29, 2012  6:19 AM      Component Value Range Status Comment   MRSA by PCR POSITIVE (*) NEGATIVE Final      Studies: Dg Chest Port 1 View 05/29/2012  *  IMPRESSION: Worsening bilateral pneumonia.      Scheduled Meds:   . amLODipine  2.5 mg Per Tube Daily  . antiseptic oral rinse  1 application Mouth Rinse QID  . aspirin  81 mg Oral Daily  . chlorhexidine  15 mL Mouth/Throat BID  . donepezil  10 mg Oral Daily  . feeding supplement  30 mL Per Tube q12n4p  . free water  250 mL Per Tube Q4H  . insulin aspart  0-15 Units Subcutaneous Q4H  . metoprolol tartrate  12.5 mg Per Tube Q12H  . pantoprazole sodium  40 mg Per Tube Q24H  . piperacillin-tazobactam (ZOSYN)  IV  3.375 g Intravenous Q8H  . potassium chloride  40 mEq Per Tube Daily  . vancomycin  750 mg Intravenous Q12H   Continuous Infusions:   . dextrose    . feeding supplement (OSMOLITE 1.2 CAL) 1,000 mL (05/29/12 2021)

## 2012-05-30 NOTE — Progress Notes (Signed)
Speech Language Pathology Dysphagia Treatment Patient Details Name: Ernest Kramer MRN: [id number] DOB: 1928/03/03 Today's Date: 05/30/2012 Time: 4098-1191 SLP Time Calculation (min): 27 min  Assessment / Plan / Recommendation Clinical Impression  Pt presents with no change in function since previous observation/treatment. Pt with open mouth breathing, very congested. SLP provided oral care with pt aware and making lingual movements in response, very limited labial closure with swab. Could not follow commands to cough or clear throat. SLp provided max tactile cues to facilitate oral responsiveness to ice chip. Pt could not achieve anterior-posterior transit of ice or any swallow response. Weak delayed cough even after suction. Prognosis for PO intake (safely or for comfort) very poor. SLP will f/u once more to assess for any clinical improvment, would likely benefit from goals of care discussion at this point     Diet Recommendation  Continue with Current Diet: NPO    SLP Plan Continue with current plan of care   Pertinent Vitals/Pain NA   Swallowing Goals  SLP Swallowing Goals Goal #3: Patient will orally manipulate bolus and initiate swallow with clinician provided po trials with min assist to differentially diagnose dysphagia at bedside.  Swallow Study Goal #3 - Progress: Not met  General Temperature Spikes Noted: No Respiratory Status: Supplemental O2 delivered via (comment) Behavior/Cognition: Confused;Lethargic;Distractible;Requires cueing;Doesn't follow directions;Decreased sustained attention Oral Cavity - Dentition: Poor condition;Missing dentition Patient Positioning: Upright in bed  Oral Cavity - Oral Hygiene Patient is HIGH RISK - Oral Care Protocol followed (see row info): Yes Patient is mechanically ventilated, follow VAP prevention protocol for oral care: Oral care provided every 4 hours   Dysphagia Treatment Treatment focused on: Upgraded PO texture  trials;Facilitation of oral phase Treatment Methods/Modalities: Skilled observation Patient observed directly with PO's: Yes Type of PO's observed: Ice chips Feeding: Total assist Liquids provided via: Teaspoon Oral Phase Signs & Symptoms: Prolonged oral phase Pharyngeal Phase Signs & Symptoms: Delayed cough Type of cueing: Tactile;Verbal;Visual Amount of cueing: Maximal   GO    Harlon Ditty, MA CCC-SLP [phone]  Claudine Mouton 05/30/2012, 3:37 PM

## 2012-05-30 NOTE — Progress Notes (Signed)
Patient discussed in [facility] meeting and I have messaged MD to determine plans- patient was admitted from [facility]- GOC was suggested by this CSW today and await MD f/u for further planning- will attempt to reach family as well- Reece Levy, MSW, Theresia Majors [phone]

## 2012-05-31 DIAGNOSIS — E46 Unspecified protein-calorie malnutrition: Secondary | ICD-10-CM | POA: Diagnosis present

## 2012-05-31 DIAGNOSIS — D696 Thrombocytopenia, unspecified: Secondary | ICD-10-CM | POA: Diagnosis present

## 2012-05-31 DIAGNOSIS — E870 Hyperosmolality and hypernatremia: Secondary | ICD-10-CM | POA: Diagnosis present

## 2012-05-31 DIAGNOSIS — D539 Nutritional anemia, unspecified: Secondary | ICD-10-CM | POA: Diagnosis present

## 2012-05-31 DIAGNOSIS — Z8679 Personal history of other diseases of the circulatory system: Secondary | ICD-10-CM

## 2012-05-31 LAB — CBC
MCH: 34.4 pg — ABNORMAL HIGH (ref 26.0–34.0)
MCHC: 32.6 g/dL (ref 30.0–36.0)
MCV: 105.4 fL — ABNORMAL HIGH (ref 78.0–100.0)
Platelets: 256 10*3/uL (ref 150–400)
RBC: 2.24 MIL/uL — ABNORMAL LOW (ref 4.22–5.81)

## 2012-05-31 LAB — BASIC METABOLIC PANEL
CO2: 29 mEq/L (ref 19–32)
Calcium: 8.2 mg/dL — ABNORMAL LOW (ref 8.4–10.5)
Creatinine, Ser: 0.84 mg/dL (ref 0.50–1.35)
GFR calc non Af Amer: 79 mL/min — ABNORMAL LOW (ref >90)
Sodium: 153 mEq/L — ABNORMAL HIGH (ref 135–145)

## 2012-05-31 LAB — PROTIME-INR: Prothrombin Time: 19.2 seconds — ABNORMAL HIGH (ref 11.6–15.2)

## 2012-05-31 LAB — GLUCOSE, CAPILLARY
Glucose-Capillary: 136 mg/dL — ABNORMAL HIGH (ref 70–99)
Glucose-Capillary: 96 mg/dL (ref 70–99)
Glucose-Capillary: 115 mg/dL — ABNORMAL HIGH (ref 70–99)

## 2012-05-31 MED ORDER — IPRATROPIUM BROMIDE 0.02 % IN SOLN: 0.5000 mg | Freq: Four times a day (QID) | RESPIRATORY_TRACT | Status: DC | PRN

## 2012-05-31 MED ORDER — POTASSIUM CHLORIDE 20 MEQ/15ML (10%) PO LIQD
20.0000 meq | Freq: Three times a day (TID) | ORAL | Status: DC
  Filled 2012-05-31: qty 15

## 2012-05-31 MED ORDER — ALBUTEROL SULFATE (5 MG/ML) 0.5% IN NEBU: 2.5000 mg | INHALATION_SOLUTION | Freq: Four times a day (QID) | RESPIRATORY_TRACT | Status: DC | PRN

## 2012-05-31 MED ORDER — SCOPOLAMINE 1 MG/3DAYS TD PT72
1.0000 | MEDICATED_PATCH | TRANSDERMAL | Status: DC
  Administered 2012-06-01 – 2012-06-03 (×2): 1.5 mg via TRANSDERMAL
  Filled 2012-05-31 (×2): qty 1

## 2012-05-31 MED ORDER — POTASSIUM CHLORIDE 20 MEQ/15ML (10%) PO LIQD
40.0000 meq | Freq: Once | ORAL | Status: AC
  Administered 2012-05-31: 40 meq
  Filled 2012-05-31: qty 30

## 2012-05-31 NOTE — Progress Notes (Signed)
[facility] PROGRESS NOTE  Ernest Kramer WUJ:[id number] DOB: 09-Nov-1927 DOA: June 23, 2012 PCP: No primary provider on file.  Brief narrative: Ernest Kramer is an 78 year old male with history of CLL, dementia who was initially admitted to ICU 2012-06-23 for acute hypoxic respiratory failure requiring intubation and mechanical ventilation and septic shock. Patient was treated with broad spectrum antibiotics, aggressive volume resuscitation efforts and pressors. Eventually weaned off pressors, extubated and then made DNR/DNI. Patient transferred to [facility] service 05/30/2012.  Ernest Kramer held a goals of care meeting with the patient and the patient's daughter, Ernest Kramer, and explained to him and his daughter that his medical condition is unlikely to improve with his current regimen, including but not limited to antibiotics. This time, the patient and family want aggressive but not heroic measures.   Assessment/Plan: Principal Problem:  *Acute respiratory failure secondary to healthcare associated versus aspiration pneumonia / septic shock  Admitted to the ICU under the care of critical care team and maintained on mechanical ventilation 06/23/2012-05/28/12.  Placed on broad-spectrum antibiotics including vancomycin and Zosyn.  Pressor support and volume resuscitation provided until hemodynamically stable.  Transferred to the care of the hospitalist service on 05/30/2012.  Goals of care discussed with the patient and his family by Ernest Kramer, revisited these issues with the patient's daughter on 05/31/2012 and she is now requesting comfort measures. Active Problems:  Thrombocytopenia / macrocytic anemia  Likely reflective of CLL.  Protein calorie malnutrition  Discontinue tube feeding. Full comfort care.  Hypernatremia  Hypervolemic with significant anasarca. Discontinue tube feeds, free water boluses, and further lab work.  History of atrial fibrillation  Rate controlled, now off Coumadin.  LEUKEMIA, LYMPHOCYTIC, CHRONIC  Chronic elevation of white blood cells noted. Under the care of Ernest Kramer.  No prior treatment.  DYSLIPIDEMIA  On statin as an outpatient, currently on hold.  History of HYPERTENSION  Hypotensive on admission. D/C blood pressure medications. Can use a clonidine patch if needed.  DVT /  PULMONARY EMBOLISM, HX OF  Has been on Coumadin in the past, this is currently discontinued.  TRANSIENT ISCHEMIC ATTACKS, HX OF  Has been on aspirin, but is currently on hold.  Acute encephalopathy  Likely from toxic causes secondary to sepsis.  Hypokalemia  We'll give 40 mEq of potassium elixir prior to the discontinuation of his nasogastric tube.  Code Status: DNR Family Communication: Lengthy discussion with daughter held at bedside.  Wants comfort care. Disposition Plan: SNF versus residential hospice.   Medical Consultants:  Ernest Kramer, PCCM  Other Consultants:  Dietician  Procedures: ETT: 9/4 >>>9/06  Lines: R IJ CVL 9/4 >>>  Antibiotics:  Zosyn 06/23/2012---> 05/31/2012  Vancomycin 23-Jun-2012---> 05/31/2012  HPI/Subjective: [name] is awake and restless. He attempts to speak but I cannot understand him because his voice is so weak. He has loud audible rhonchi and appears to be struggling with his breath.   Objective: Filed Vitals:   05/30/12 1329 05/30/12 2053 05/31/12 0610 05/31/12 1341  BP: 119/93 137/42 126/45 128/44  Pulse: 65 71 80 76  Temp: 97.6 F (36.4 C) 97.6 F (36.4 C) 98.2 F (36.8 C) 98.2 F (36.8 C)  TempSrc: Axillary Axillary  Axillary  Resp: 18 18 20 16   Height:      Weight:      SpO2: 94% 95% 87% 92%    Intake/Output Summary (Last 24 hours) at 05/31/12 1729 Last data filed at 05/31/12 1100  Gross per 24 hour  Intake   3300 ml  Output    902 ml  Net   2398 ml    Exam: Gen:  Restless Cardiovascular:  RRR, No M/R/G Respiratory: Lungs  with audible rhonchi  Gastrointestinal: Abdomen soft, NT/ND with  normal active bowel sounds. Extremities: Diffuse anasarca in the upper and lower extremities  Data Reviewed: Basic Metabolic Panel:  Lab 05/31/12 2706 05/29/12 0549 05/28/12 0529 05/27/12 0530 05/26/12 0355 05/25/12 0430  NA 153* 154* 152* 149* 148* --  K 2.9* 3.0* -- -- -- --  CL 120* 121* 121* 118* 117* --  CO2 29 27 27 25 23  --  GLUCOSE 162* 114* 99 123* 113* --  BUN 27* 28* 30* 30* 25* --  CREATININE 0.84 0.92 0.90 1.09 1.12 --  CALCIUM 8.2* 8.0* 8.3* 8.1* 7.8* --  MG -- -- -- -- -- 1.8  PHOS -- -- -- -- -- --   GFR Estimated Creatinine Clearance: 78.8 ml/min (by C-G formula based on Cr of 0.84). Liver Function Tests:  Lab 05/25/12 0430  AST 13  ALT 14  ALKPHOS 150*  BILITOT 0.7  PROT 4.2*  ALBUMIN 1.9*   Coagulation profile  Lab 05/31/12 0500 05/30/12 0545 05/29/12 0549 05/28/12 0529 05/27/12 0530  INR 1.58* 1.55* 1.80* 2.00* 2.43*  PROTIME -- -- -- -- --    CBC:  Lab 05/31/12 0500 05/28/12 0529 05/27/12 0530 05/26/12 0355 05/25/12 0430  WBC 33.6* 26.1* 22.1* 21.7* 32.4*  NEUTROABS -- -- -- -- --  HGB 7.7* 9.3* 9.4* 8.7* 10.0*  HCT 23.6* 27.9* 27.9* 25.6* 29.1*  MCV 105.4* 102.6* 102.6* 100.4* 99.7  PLT 256 128* 106* 89* 86*   BNP (last 3 results)  Basename 05/28/2012 0200  PROBNP 1248.0*   CBG:  Lab 05/31/12 1608 05/31/12 1427 05/31/12 0842 05/31/12 0400 05/31/12 0105  GLUCAP 115* 96 136* 128* 162*   Microbiology Recent Results (from the past 240 hour(s))  CULTURE, BLOOD (ROUTINE X 2)     Status: Normal   Collection Time   06/02/2012  2:00 AM      Component Value Range Status Comment   Specimen Description BLOOD RIGHT ANTECUBITAL   Final    Special Requests BOTTLES DRAWN AEROBIC ONLY   Final    Culture  Setup Time 06/19/2012 09:52   Final    Culture NO GROWTH 5 DAYS   Final    Report Status 05/30/2012 FINAL   Final   CULTURE, BLOOD (ROUTINE X 2)     Status: Normal   Collection Time   06/13/2012  2:32 AM      Component Value Range Status Comment     Specimen Description BLOOD R HAND   Final    Special Requests BOTTLES DRAWN AEROBIC AND ANAEROBIC   Final    Culture  Setup Time 06/19/2012 09:52   Final    Culture NO GROWTH 5 DAYS   Final    Report Status 05/30/2012 FINAL   Final   MRSA PCR SCREENING     Status: Abnormal   Collection Time   06/02/2012  6:19 AM      Component Value Range Status Comment   MRSA by PCR POSITIVE (*) NEGATIVE Final      Studies: Dg Chest Port 1 View  05/29/2012  *RADIOLOGY REPORT*  Clinical Data: Pneumonia and respiratory failure.  PORTABLE CHEST - 1 VIEW  Comparison: 05/27/2012  Findings: Central line and nasogastric tube positioning are stable. Lungs show interval worsening of airspace disease in the left lower lung and slight  worsening of airspace disease in the right upper lung.  There is persistent associated pulmonary vascular congestion.  No large pleural effusions are identified.  IMPRESSION: Worsening bilateral pneumonia.   Original Report Authenticated By: Reola Calkins, M.D.    Dg Chest Port 1 View  05/27/2012  *RADIOLOGY REPORT*  Clinical Data: Follow up pneumonia.  PORTABLE CHEST - 1 VIEW  Comparison: 05/26/2012.  Findings: The endotracheal tube has been removed.  The NG tube and right IJ catheters are stable.  Decreased lung volumes post extubation with increase and perihilar and basilar atelectasis. Suspect small effusions.  No pneumothorax.  IMPRESSION:  1.  Status post extubation with lower lung volumes and increase in perihilar and basilar atelectasis. 2.  Probable small effusions.   Original Report Authenticated By: P. Loralie Champagne, M.D.    Dg Chest Port 1 View  05/26/2012  *RADIOLOGY REPORT*  Clinical Data: Pneumonia  PORTABLE CHEST - 1 VIEW  Comparison: 05/25/2012  Findings: Endotracheal tube, NG tube, right internal jugular vein central venous catheter are stable.  Bibasilar pulmonary opacities left greater than right are stable.  Low lung volumes.  No pneumothorax.  IMPRESSION: Stable  bibasilar opacities left greater than right.   Original Report Authenticated By: Donavan Burnet, M.D.    Dg Chest Port 1 View  05/25/2012  *RADIOLOGY REPORT*  Clinical Data: Follow up pneumonia.  PORTABLE CHEST - 1 VIEW  Comparison: 06/02/2012.  Findings: Endotracheal tube tip is 2.4 cm from the carina.  Enteric tube and right IJ central line remain present, unchanged in position.  The tip of the nasogastric tube is at the cardia of the stomach.  Left pleural effusion and left greater than right basilar atelectasis.  Probable airspace disease at the left lung base. Atelectasis at the right lung base has been increasing since 06/05/2012.  IMPRESSION:  1.  Support apparatus in good position as described above. 2.  Stable pulmonary aeration with bilateral basilar opacities, left greater than right, likely a combination of airspace disease and atelectasis.   Original Report Authenticated By: Andreas Newport, M.D.    Dg Chest Portable 1 View  05/28/2012  *RADIOLOGY REPORT*  Clinical Data: Respiratory distress  PORTABLE CHEST - 1 VIEW  Comparison: Portable exam 0346 hours compared to 0225 hours  Findings: Tip of endotracheal tube 3.2 cm above carina. Nasogastric tube projects over stomach. Right jugular central venous catheter, tip projects over SVC. Stable heart size and mediastinal contours. Bilateral pulmonary infiltrates, little changed. No pneumothorax.  IMPRESSION: Bilateral pulmonary infiltrates question edema versus infection.   Original Report Authenticated By: Lollie Marrow, M.D.    Dg Chest Port 1 View  06/18/2012  *RADIOLOGY REPORT*  Clinical Data: Intubation and central line placement, respiratory distress  PORTABLE CHEST - 1 VIEW  Comparison: Portable exam 0225 hours compared to 11/29/2007  Findings: Tip of endotracheal tube 5.4 cm above carina. Right jugular central venous catheter, tip projecting over SVC. Normal heart size, mediastinal contours, and pulmonary vascularity. Atherosclerotic  calcification of a minimally tortuous thoracic aorta. Bilateral perihilar and basilar infiltrates. Small layered pleural effusion at left lung base not excluded. No pneumothorax. Diffuse osseous demineralization.  IMPRESSION: Line and tube positions as above. Perihilar infiltrates question edema versus infection.   Original Report Authenticated By: Lollie Marrow, M.D.     Scheduled Meds:    . amLODipine  2.5 mg Per Tube Daily  . antiseptic oral rinse  1 application Mouth Rinse QID  . aspirin  81 mg Oral Daily  .  chlorhexidine  15 mL Mouth/Throat BID  . donepezil  10 mg Oral Daily  . feeding supplement  30 mL Per Tube q12n4p  . free water  250 mL Per Tube Q4H  . insulin aspart  0-15 Units Subcutaneous Q4H  . metoprolol tartrate  12.5 mg Per Tube Q12H  . pantoprazole sodium  40 mg Per Tube Q24H  . piperacillin-tazobactam (ZOSYN)  IV  3.375 g Intravenous Q8H  . vancomycin  750 mg Intravenous Q12H   Continuous Infusions:    . dextrose    . feeding supplement (OSMOLITE 1.2 CAL) 1,000 mL (05/30/12 2149)    Time spent: 35 minutes   LOS: 7 days   RAMA,CHRISTINA  [facility] Pager [phone].  If 8PM-8AM, please contact night-coverage at [email or web address], password [facility] 05/31/2012, 5:29 PM

## 2012-05-31 NOTE — Progress Notes (Signed)
ANTIBIOTIC CONSULT NOTE - FOLLOW UP  Pharmacy Consult for Vanco, Zosyn Indication: suspected HCAP vs aspiration PNA  No Known Allergies  Patient Measurements: Height: 6\' 2"  (188 cm) Weight: 205 lb 11 oz (93.3 kg) IBW/kg (Calculated) : 82.2   Vital Signs: Temp: 98.2 F (36.8 C) (09/11 1341) Temp src: Axillary (09/11 1341) BP: 128/44 mmHg (09/11 1341) Pulse Rate: 76  (09/11 1341) Intake/Output from previous day: 09/10 0701 - 09/11 0700 In: 3660 [I.V.:360; NG/GT:1700; IV Piggyback:1600] Out: 901 [Urine:900; Stool:1] Intake/Output from this shift: Total I/O In: -  Out: 1 [Stool:1]  Labs:  Basename 05/31/12 0500 05/29/12 0549  WBC 33.6* --  HGB 7.7* --  PLT 256 --  LABCREA -- --  CREATININE 0.84 0.92   Estimated Creatinine Clearance: 78.8 ml/min (by C-G formula based on Cr of 0.84). No results found for this basename: VANCOTROUGH:2,VANCOPEAK:2,VANCORANDOM:2,GENTTROUGH:2,GENTPEAK:2,GENTRANDOM:2,TOBRATROUGH:2,TOBRAPEAK:2,TOBRARND:2,AMIKACINPEAK:2,AMIKACINTROU:2,AMIKACIN:2, in the last 72 hours   Assessment:  84 yom on Day #8 of Vancomycin and Zosyn for suspected HCAP vs aspiration PNA.  Afebrile, WBC elevated with CLL. Stable renal function.  Vanc trough 9/7 in goal range  Blood cultures 9/4 NGTD.  MRSA pcr negative.  Need discussion regarding GOC.    CXR increase in perihilar and basilar atelectasis and small pleural effusions.  Goal of Therapy:  Vancomycin trough level 15-20 mcg/ml  Plan:  No change to Vancomycin 750 mg q12/Zosyn 3.375 gm q8 Would plan repeat Vanc trough if continued  Otho Bellows PharmD Pager [phone] 05/31/2012,3:39 PM   -------------------------------------------------------------------------------------------- Addendum:   Labs: VT 18.6   A/P: Continue current dosing of vancomycin of 750mg  IV q12   Hessie Knows, PharmD, BCPS Pager [phone] 05/31/2012 3:39 PM

## 2012-05-31 NOTE — Progress Notes (Addendum)
Speech Language Pathology Dysphagia Treatment Patient Details Name: Ernest Kramer MRN: [id number] DOB: [birth year] Today's Date: 05/31/2012 Time: 7829-5621 SLP Time Calculation (min): 50 min  Assessment / Plan / Recommendation Clinical Impression  Patient was more alert today, but is confused and agitated (appears uncomfortable).  SLP provided oral care and aggressive suctioning in an effort to clear pharyngeal secretions.  Ratteling respirations persisted despite attempts to clear.  Patient was given ice chips, with noted oral delay and delay to initiate the swallow, and a positive cough after the swallow.  Patient continues to be at risk for aspiration, at this time, due to suspected pharyngeal secretions that patient can not clear.  A large bore NG tube is in place for feeding at this time, with patient's hands in mitts.  Son, Ernest Kramer on [place]) came in as SLP was writing note.  RN and SLP had in-depth discussion re: GOC.  Discussed patient's poor prognosis, poor quality of life, and need for consideration of palliative care to improve patient comfort and decrease suffering.  Son reports that there is an ongoing dispute b/w him and his siblings re: GOC.  "They want to let him go because they are tired of dealing with all of this.  They are like vultures and want him to die so they can have everything.  I want him to know he is still loved and that I want him here."  The son also voiced a desire for his grand-son, who is in first grade, to know and remember the patient.  Long discussion continued re: focusing on what is best for his Dad, not what he thinks everyone else wants. Son appeared appreciative, but stated he did not want hospice, as "They won't do anything for him.  They just watch him die."         Diet Recommendation  Continue with Current Diet: NPO  Clarify health care POA and GOC (wife was just d/c'd from Rehab. And has some disability re: son's help, per son's report).  SLP  Plan Discharge SLP treatment due to (comment) (no progress toward ability to take p.o.'s)   Pertinent Vitals/Pain Not able to state.   Swallowing Goals  SLP Swallowing Goals Goal #3: Patient will orally manipulate bolus and initiate swallow with clinician provided po trials with min assist to differentially diagnose dysphagia at bedside.  Swallow Study Goal #3 - Progress: Not met Swallow Study Goal #4 - Progress: Not met  General Temperature Spikes Noted: No Respiratory Status: Supplemental O2 delivered via (comment) (Rattling respirations, very congested) Behavior/Cognition: Distractible;Doesn't follow directions;Decreased sustained attention;Confused;Agitated Oral Cavity - Dentition: Missing dentition;Poor condition Patient Positioning: Upright in bed  Oral Cavity - Oral Hygiene Does patient have any of the following "at risk" factors?: Saliva - thick, dry mouth;Oxygen therapy - cannula, mask, simple oxygen devices Brush patient's teeth BID with toothbrush (using toothpaste with fluoride): Yes Patient is HIGH RISK - Oral Care Protocol followed (see row info): Yes Patient is AT RISK - Oral Care Protocol followed (see row info): Yes Patient is mechanically ventilated, follow VAP prevention protocol for oral care: Oral care provided every 4 hours   Dysphagia Treatment Treatment focused on: Upgraded PO texture trials;Facilitation of oral phase Treatment Methods/Modalities: Skilled observation Patient observed directly with PO's: Yes Type of PO's observed: Ice chips Feeding: Total assist Liquids provided via: Teaspoon Oral Phase Signs & Symptoms: Prolonged oral phase Pharyngeal Phase Signs & Symptoms: Suspected delayed swallow initiation;Audible swallow;Delayed cough Type of cueing: Tactile;Verbal;Visual Amount of cueing:  Maximal   GO     Ernest Kramer T 05/31/2012, 4:10 PM

## 2012-06-01 DIAGNOSIS — R6521 Severe sepsis with septic shock: Secondary | ICD-10-CM

## 2012-06-01 DIAGNOSIS — A419 Sepsis, unspecified organism: Secondary | ICD-10-CM

## 2012-06-01 NOTE — Progress Notes (Addendum)
[facility] PROGRESS NOTE  Ernest Kramer ZHY:[id number] DOB: 09-23-1927 DOA: 05/27/2012 PCP: No primary provider on file.  Brief narrative: Ernest Kramer is an 78 year old male with history of CLL, dementia who was initially admitted to ICU 05/27/2012 for acute hypoxic respiratory failure requiring intubation and mechanical ventilation and septic shock. Patient was treated with broad spectrum antibiotics, aggressive volume resuscitation efforts and pressors. Eventually weaned off pressors, extubated and then made DNR/DNI. Patient transferred to [facility] service 05/30/2012.  Dr.Devine held a goals of care meeting with the patient and the patient's daughter, Ernest Kramer, and explained to him and his daughter that his medical condition is unlikely to improve with his current regimen, including but not limited to antibiotics. As of 05/31/12, the patient's daughter has requested that we transition to comfort care.   Assessment/Plan: Principal Problem:  *Acute respiratory failure secondary to healthcare associated versus aspiration pneumonia / septic shock  Admitted to the ICU under the care of critical care team and maintained on mechanical ventilation 05/27/12-05/28/12.  Placed on broad-spectrum antibiotics including vancomycin and Zosyn for an 8 day course.  Pressor support and volume resuscitation provided until hemodynamically stable.  Transferred to the care of the hospitalist service on 05/30/2012.  Goals of care discussed with the patient and his family by Dr. Elisabeth Pigeon, revisited these issues with the patient's daughter on 05/31/2012 and she is now requesting comfort measures. Active Problems:  Thrombocytopenia / macrocytic anemia  Likely reflective of CLL.  Protein calorie malnutrition  Tube feeding discontinued 05/31/12. Full comfort care.  Hypernatremia  Hypervolemic with significant anasarca. Discontinue tube feeds, free water boluses, and further lab work on 05/31/12.  History of atrial  fibrillation  Rate controlled, now off Coumadin.  LEUKEMIA, LYMPHOCYTIC, CHRONIC  Chronic elevation of white blood cells noted. Under the care of Dr. Welton Flakes.  No prior treatment.  DYSLIPIDEMIA  On statin as an outpatient, currently on hold.  History of HYPERTENSION  Hypotensive on admission. Blood pressure medications discontinued 9/11/3.  BP not appreciably elevated. Can use a clonidine patch if needed.  DVT /  PULMONARY EMBOLISM, HX OF  Has been on Coumadin in the past, this is currently discontinued.  TRANSIENT ISCHEMIC ATTACKS, HX OF  Has been on aspirin, but is currently on hold.  Acute encephalopathy  Likely from toxic causes secondary to sepsis.  Hypokalemia  Given 40 mEq of potassium elixir prior to the discontinuation of his nasogastric tube 05/31/12.  Code Status: DNR Family Communication: Lengthy discussion with daughter Ernest Kramer) held at bedside 05/31/12.  Wants comfort care. Disposition Plan: SNF versus residential hospice.   Medical Consultants:  Dr. Koren Bound, PCCM  Other Consultants:  Dietician  Procedures: ETT: 9/4 >>>9/06  Lines: R IJ CVL 9/4 >>>  Antibiotics:  Zosyn 2012-05-27---> 05/31/2012  Vancomycin 05-27-12---> 05/31/2012  HPI/Subjective: Ernest Kramer is less restless today.  He has some spontaneous movement to stimulation, but no purposeful activity.  His condition is rapidly deteriorating.   Objective: Filed Vitals:   05/31/12 0610 05/31/12 1341 05/31/12 2030 06/01/12 0543  BP: 126/45 128/44 131/73 132/43  Pulse: 80 76 86 60  Temp: 98.2 F (36.8 C) 98.2 F (36.8 C) 97.8 F (36.6 C) 97.7 F (36.5 C)  TempSrc:  Axillary Oral Oral  Resp: 20 16 18 18   Height:      Weight:      SpO2: 87% 92%  96%    Intake/Output Summary (Last 24 hours) at 06/01/12 1310 Last data filed at 06/01/12 2952  Gross per 24 hour  Intake      0 ml  Output      1 ml  Net     -1 ml    Exam: Gen:  Less restless Cardiovascular:  RRR, No  M/R/G Respiratory: Lungs  with audible rhonchi  Gastrointestinal: Abdomen soft, NT/ND with normal active bowel sounds. Extremities: Diffuse anasarca in the upper and lower extremities  Data Reviewed: Basic Metabolic Panel:  Lab 05/31/12 1610 05/29/12 0549 05/28/12 0529 05/27/12 0530 05/26/12 0355  NA 153* 154* 152* 149* 148*  K 2.9* 3.0* -- -- --  CL 120* 121* 121* 118* 117*  CO2 29 27 27 25 23   GLUCOSE 162* 114* 99 123* 113*  BUN 27* 28* 30* 30* 25*  CREATININE 0.84 0.92 0.90 1.09 1.12  CALCIUM 8.2* 8.0* 8.3* 8.1* 7.8*  MG -- -- -- -- --  PHOS -- -- -- -- --   GFR Estimated Creatinine Clearance: 78.8 ml/min (by C-G formula based on Cr of 0.84). Liver Function Tests: No results found for this basename: AST:5,ALT:5,ALKPHOS:5,BILITOT:5,PROT:5,ALBUMIN:5 in the last 168 hours Coagulation profile  Lab 05/31/12 0500 05/30/12 0545 05/29/12 0549 05/28/12 0529 05/27/12 0530  INR 1.58* 1.55* 1.80* 2.00* 2.43*  PROTIME -- -- -- -- --    CBC:  Lab 05/31/12 0500 05/28/12 0529 05/27/12 0530 05/26/12 0355  WBC 33.6* 26.1* 22.1* 21.7*  NEUTROABS -- -- -- --  HGB 7.7* 9.3* 9.4* 8.7*  HCT 23.6* 27.9* 27.9* 25.6*  MCV 105.4* 102.6* 102.6* 100.4*  PLT 256 128* 106* 89*   BNP (last 3 results)  Basename 05/23/2012 0200  PROBNP 1248.0*   CBG:  Lab 05/31/12 1608 05/31/12 1427 05/31/12 0842 05/31/12 0400 05/31/12 0105  GLUCAP 115* 96 136* 128* 162*   Microbiology Recent Results (from the past 240 hour(s))  CULTURE, BLOOD (ROUTINE X 2)     Status: Normal   Collection Time   06/07/2012  2:00 AM      Component Value Range Status Comment   Specimen Description BLOOD RIGHT ANTECUBITAL   Final    Special Requests BOTTLES DRAWN AEROBIC ONLY   Final    Culture  Setup Time 06/01/2012 09:52   Final    Culture NO GROWTH 5 DAYS   Final    Report Status 05/30/2012 FINAL   Final   CULTURE, BLOOD (ROUTINE X 2)     Status: Normal   Collection Time   06/01/2012  2:32 AM      Component Value Range  Status Comment   Specimen Description BLOOD R HAND   Final    Special Requests BOTTLES DRAWN AEROBIC AND ANAEROBIC   Final    Culture  Setup Time 05/26/2012 09:52   Final    Culture NO GROWTH 5 DAYS   Final    Report Status 05/30/2012 FINAL   Final   MRSA PCR SCREENING     Status: Abnormal   Collection Time   06/02/2012  6:19 AM      Component Value Range Status Comment   MRSA by PCR POSITIVE (*) NEGATIVE Final      Studies:  Dg Chest Port 1 View 05/29/2012  IMPRESSION: Worsening bilateral pneumonia.   Original Report Authenticated By: Reola Calkins, M.D.     Dg Chest Port 1 View 05/27/2012 IMPRESSION:  1.  Status post extubation with lower lung volumes and increase in perihilar and basilar atelectasis. 2.  Probable small effusions.   Original Report Authenticated By: P. MARK GALLERANI,  M.D.     Dg Chest Port 1 View 05/26/2012 IMPRESSION: Stable bibasilar opacities left greater than right.   Original Report Authenticated By: Donavan Burnet, M.D.     Dg Chest Port 1 View 05/25/2012 IMPRESSION:  1.  Support apparatus in good position as described above. 2.  Stable pulmonary aeration with bilateral basilar opacities, left greater than right, likely a combination of airspace disease and atelectasis.   Original Report Authenticated By: Andreas Newport, M.D.     Dg Chest Portable 1 View 05/29/2012 IMPRESSION: Bilateral pulmonary infiltrates question edema versus infection.   Original Report Authenticated By: Lollie Marrow, M.D.     Dg Chest Port 1 View 05/28/2012 IMPRESSION: Line and tube positions as above. Perihilar infiltrates question edema versus infection.   Original Report Authenticated By: Lollie Marrow, M.D.     Scheduled Meds:    . antiseptic oral rinse  1 application Mouth Rinse QID  . chlorhexidine  15 mL Mouth/Throat BID  . potassium chloride  40 mEq Per Tube Once  . scopolamine  1 patch Transdermal Q72H  . DISCONTD: amLODipine  2.5 mg Per Tube Daily  . DISCONTD: aspirin  81  mg Oral Daily  . DISCONTD: donepezil  10 mg Oral Daily  . DISCONTD: feeding supplement  30 mL Per Tube q12n4p  . DISCONTD: free water  250 mL Per Tube Q4H  . DISCONTD: insulin aspart  0-15 Units Subcutaneous Q4H  . DISCONTD: metoprolol tartrate  12.5 mg Per Tube Q12H  . DISCONTD: pantoprazole sodium  40 mg Per Tube Q24H  . DISCONTD: piperacillin-tazobactam (ZOSYN)  IV  3.375 g Intravenous Q8H  . DISCONTD: potassium chloride  20 mEq Oral TID  . DISCONTD: vancomycin  750 mg Intravenous Q12H   Continuous Infusions:    . DISCONTD: dextrose    . DISCONTD: feeding supplement (OSMOLITE 1.2 CAL) 1,000 mL (05/30/12 2149)    Time spent: 25 minutes   LOS: 8 days   Donat Humble  [facility] Pager [phone].  If 8PM-8AM, please contact night-coverage at [email or web address], password [facility] 06/01/2012, 1:10 PM

## 2012-06-01 NOTE — Plan of Care (Signed)
Problem: Phase I Progression Outcomes Goal: Goals of care identified Outcome: Progressing GOC mtg 06/10/2012 @ 0900.

## 2012-06-01 NOTE — Progress Notes (Signed)
Spoke with patient's daughter/POASelena Batten, who states she is uncertain of how long he has- she does feel like he is declining and likely does not have long- we discussed the options of possibly moving back to SNF bed at [facility] -vs- Residential Hospice setting. She is open to pursuing these options in case he is able to transfer out- will ask Dr. Darnelle Catalan to advise on this- Family would prefer [facility] for residential hospice and/or [facility] (SNF) however she feels that SNF wont be able to manage his care (no mits to prevent IV from being pulled out).... I will proceed with SNF and Hospice Home options and follow- Reece Levy, MSW, [employer] [phone]

## 2012-06-01 NOTE — Plan of Care (Signed)
Problem: Phase I Progression Outcomes Goal: Initial discharge plan identified Outcome: Progressing Residential hospice ([facility]) first choice.

## 2012-06-01 NOTE — Progress Notes (Addendum)
Patient ZO:XWRUEAV KAULANA BRINDLE      DOB: 1927/09/29      WUJ:[id number]  Patient is an 78 yo WM PMH CLL, TIA's, PE, and Dementia admitted 2012-06-09 from [facility] with hypoxic respiratory failue-required and septic shock.  Intubated, broad spectrum antibiotic support, aggressive volume resuscitation, and pressor support. Patient stablized extubated, now on medical floor. Currently NPO per SLP evaluation. Code status currently DNR/DNI. Patient non-responsive, unable to have Morphine or Ativan per Dr Darnelle Catalan gets extremely agitated. Toelrating Fentanyl IV as needed for comfort.  Spoke with [name] HPOA ( cell: [phone]), agreed to have PMT meeting, 05/23/2012 at 9:00a , brothers Christen Bame and Clide Cliff will also attend.Ms Clearance Coots given PMT contact phone information if meeting needs to be rescheduled.  Freddie Breech, CNS-C Palliative Medicine Team [facility] Health Team Phone: [phone] Pager: [phone]

## 2012-06-01 NOTE — Progress Notes (Signed)
Gave report to [facility] department.  He verbalized understanding.  Will transfer to room 1325.

## 2012-06-02 NOTE — Progress Notes (Signed)
Spoke with POA, Selena Batten [phone], who states tomorrow the family will meet with the PMT @ 9am. I have spoken with the [facility] and they are interested in patient IF the family decides to go that route- POA asks that we request Dr Darnelle Catalan to be at meeting tomorrow and/or give the family her feedback which I will make her aware of their request. Will also ask weekend CSW to check in after PMT meeting to further determine if patient can transfer to the [facility]. Reece Levy, MSW, [facility] [phone]

## 2012-06-02 NOTE — Progress Notes (Signed)
[facility] PROGRESS NOTE  Ernest Kramer ZOX:[id number] DOB: [birth year] DOA: June 08, 2012 PCP: Sanjuana Letters, MD  Brief narrative: Ernest Kramer is an 78 year old male with history of CLL, dementia who was initially admitted to ICU 08-Jun-2012 for acute hypoxic respiratory failure requiring intubation and mechanical ventilation and septic shock. Patient was treated with broad spectrum antibiotics, aggressive volume resuscitation efforts and pressors. Eventually weaned off pressors, extubated and then made DNR/DNI. Patient transferred to [facility] service 05/30/2012.  Dr.Devine held a goals of care meeting with the patient and the patient's daughter, Ernest Kramer, and explained to him and his daughter that his medical condition is unlikely to improve with his current regimen, including but not limited to antibiotics. As of 05/31/12, the patient's daughter has requested that we transition to comfort care.   Assessment/Plan: Principal Problem:  *Acute respiratory failure secondary to healthcare associated versus aspiration pneumonia / septic shock  Admitted to the ICU under the care of critical care team and maintained on mechanical ventilation 06-08-12-05/28/12.  Placed on broad-spectrum antibiotics including vancomycin and Zosyn for an 8 day course.  Pressor support and volume resuscitation provided until hemodynamically stable.  Transferred to the care of the hospitalist service on 05/30/2012.  Goals of care discussed with the patient and his family by Dr. Elisabeth Pigeon, revisited these issues with the patient's daughter on 05/31/2012 and she is now requesting comfort measures.  Family meeting scheduled 06/15/2012 at 9:00 a.m. Active Problems:  Thrombocytopenia / macrocytic anemia  Likely reflective of CLL.  Protein calorie malnutrition  Tube feeding discontinued 05/31/12. Full comfort care.  Hypernatremia  Hypervolemic with significant anasarca. Discontinue tube feeds, free water boluses, and further  lab work on 05/31/12.  History of atrial fibrillation  Rate controlled, now off Coumadin.  LEUKEMIA, LYMPHOCYTIC, CHRONIC  Chronic elevation of white blood cells noted. Under the care of Dr. Welton Flakes.  No prior treatment.  DYSLIPIDEMIA  On statin as an outpatient, currently on hold.  History of HYPERTENSION  Hypotensive on admission. Blood pressure medications discontinued 9/11/3.  BP not appreciably elevated. Can use a clonidine patch if needed.  DVT /  PULMONARY EMBOLISM, HX OF  Has been on Coumadin in the past, this is currently discontinued.  TRANSIENT ISCHEMIC ATTACKS, HX OF  Has been on aspirin, but is currently on hold.  Acute encephalopathy  Likely from toxic causes secondary to sepsis.  Hypokalemia  Given 40 mEq of potassium elixir prior to the discontinuation of his nasogastric tube 05/31/12.  Code Status: DNR Family Communication: Lengthy discussion with daughter Ernest Kramer [phone]) held at bedside 05/31/12. Updated by phone 06/01/12, 06/02/12.  Disposition Plan: SNF versus residential hospice.   Medical Consultants:  Dr. Koren Bound, PCCM  Other Consultants:  Dietician  Procedures: ETT: 9/4 >>>9/06  Lines: R IJ CVL 9/4 >>>  Antibiotics:  Zosyn June 08, 2012---> 05/31/2012  Vancomycin 06-08-12---> 05/31/2012  HPI/Subjective: Ernest Kramer is less restless today.  He is more awake than yesterday.  His mucous membranes are dry.  Too weak to verbalize.  Objective: Filed Vitals:   05/31/12 2030 06/01/12 0543 06/01/12 1430 06/02/12 0625  BP: 131/73 132/43 123/54 162/79  Pulse: 86 60 60 53  Temp: 97.8 F (36.6 C) 97.7 F (36.5 C) 97.6 F (36.4 C) 97.7 F (36.5 C)  TempSrc: Oral Oral Axillary Axillary  Resp: 18 18 17 18   Height:      Weight:      SpO2:  96% 90% 92%    Intake/Output Summary (Last 24 hours) at  06/02/12 1523 Last data filed at 06/02/12 0600  Gross per 24 hour  Intake      0 ml  Output   1300 ml  Net  -1300 ml    Exam: Gen:  Less  restless Cardiovascular:  RRR, No M/R/G Respiratory: Lungs  with audible rhonchi  Gastrointestinal: Abdomen soft, NT/ND with normal active bowel sounds. Extremities: Diffuse anasarca in the upper and lower extremities  Data Reviewed: Basic Metabolic Panel:  Lab 05/31/12 1610 05/29/12 0549 05/28/12 0529 05/27/12 0530  NA 153* 154* 152* 149*  K 2.9* 3.0* -- --  CL 120* 121* 121* 118*  CO2 29 27 27 25   GLUCOSE 162* 114* 99 123*  BUN 27* 28* 30* 30*  CREATININE 0.84 0.92 0.90 1.09  CALCIUM 8.2* 8.0* 8.3* 8.1*  MG -- -- -- --  PHOS -- -- -- --   GFR Estimated Creatinine Clearance: 78.8 ml/min (by C-G formula based on Cr of 0.84).  Coagulation profile  Lab 05/31/12 0500 05/30/12 0545 05/29/12 0549 05/28/12 0529 05/27/12 0530  INR 1.58* 1.55* 1.80* 2.00* 2.43*  PROTIME -- -- -- -- --    CBC:  Lab 05/31/12 0500 05/28/12 0529 05/27/12 0530  WBC 33.6* 26.1* 22.1*  NEUTROABS -- -- --  HGB 7.7* 9.3* 9.4*  HCT 23.6* 27.9* 27.9*  MCV 105.4* 102.6* 102.6*  PLT 256 128* 106*   BNP (last 3 results)  Basename 05/31/2012 0200  PROBNP 1248.0*   CBG:  Lab 05/31/12 1608 05/31/12 1427 05/31/12 0842 05/31/12 0400 05/31/12 0105  GLUCAP 115* 96 136* 128* 162*   Microbiology Recent Results (from the past 240 hour(s))  CULTURE, BLOOD (ROUTINE X 2)     Status: Normal   Collection Time   05/31/2012  2:00 AM      Component Value Range Status Comment   Specimen Description BLOOD RIGHT ANTECUBITAL   Final    Special Requests BOTTLES DRAWN AEROBIC ONLY   Final    Culture  Setup Time 05/28/2012 09:52   Final    Culture NO GROWTH 5 DAYS   Final    Report Status 05/30/2012 FINAL   Final   CULTURE, BLOOD (ROUTINE X 2)     Status: Normal   Collection Time   06/16/2012  2:32 AM      Component Value Range Status Comment   Specimen Description BLOOD R HAND   Final    Special Requests BOTTLES DRAWN AEROBIC AND ANAEROBIC   Final    Culture  Setup Time 05/23/2012 09:52   Final    Culture NO  GROWTH 5 DAYS   Final    Report Status 05/30/2012 FINAL   Final   MRSA PCR SCREENING     Status: Abnormal   Collection Time   06/11/2012  6:19 AM      Component Value Range Status Comment   MRSA by PCR POSITIVE (*) NEGATIVE Final      Studies:  Dg Chest Port 1 View 05/29/2012  IMPRESSION: Worsening bilateral pneumonia.   Original Report Authenticated By: Reola Calkins, M.D.     Dg Chest Port 1 View 05/27/2012 IMPRESSION:  1.  Status post extubation with lower lung volumes and increase in perihilar and basilar atelectasis. 2.  Probable small effusions.   Original Report Authenticated By: P. Loralie Champagne, M.D.     Dg Chest Port 1 View 05/26/2012 IMPRESSION: Stable bibasilar opacities left greater than right.   Original Report Authenticated By: Donavan Burnet, M.D.  Dg Chest Port 1 View 05/25/2012 IMPRESSION:  1.  Support apparatus in good position as described above. 2.  Stable pulmonary aeration with bilateral basilar opacities, left greater than right, likely a combination of airspace disease and atelectasis.   Original Report Authenticated By: Andreas Newport, M.D.     Dg Chest Portable 1 View 05/29/2012 IMPRESSION: Bilateral pulmonary infiltrates question edema versus infection.   Original Report Authenticated By: Lollie Marrow, M.D.     Dg Chest Port 1 View 06/01/2012 IMPRESSION: Line and tube positions as above. Perihilar infiltrates question edema versus infection.   Original Report Authenticated By: Lollie Marrow, M.D.     Scheduled Meds:    . antiseptic oral rinse  1 application Mouth Rinse QID  . chlorhexidine  15 mL Mouth/Throat BID  . scopolamine  1 patch Transdermal Q72H   Continuous Infusions:    Time spent: 25 minutes   LOS: 9 days   RAMA,CHRISTINA  [facility] Pager [phone].  If 8PM-8AM, please contact night-coverage at [email or web address], password [facility] 06/02/2012, 3:23 PM

## 2012-06-02 NOTE — Progress Notes (Signed)
Nutrition Brief Note  - TF discontinued on 9/11. Pt on comfort care in palliative unit. Recommend comfort feeds. Nutrition signing off.   Levon Hedger MS, RD, LDN [phone] Pager [phone] After Hours Pager

## 2012-06-03 MED ORDER — BISACODYL 10 MG RE SUPP: 10.0000 mg | Freq: Every day | RECTAL | Status: DC | PRN

## 2012-06-03 MED ORDER — LORAZEPAM 1 MG PO TABS
1.0000 mg | ORAL_TABLET | ORAL | Status: DC
  Administered 2012-06-03 (×3): 1 mg via SUBLINGUAL
  Filled 2012-06-03 (×3): qty 1

## 2012-06-03 MED ORDER — MORPHINE BOLUS VIA INFUSION
2.0000 mg | INTRAVENOUS | Status: DC | PRN
  Administered 2012-06-03 (×2): 2 mg via SUBCUTANEOUS
  Filled 2012-06-03: qty 2

## 2012-06-03 MED ORDER — SODIUM CHLORIDE 0.9 % IV SOLN
1.0000 mg/h | INTRAVENOUS | Status: DC
  Administered 2012-06-03: 1 mg/h via SUBCUTANEOUS
  Filled 2012-06-03: qty 10

## 2012-06-03 MED ORDER — ALBUTEROL SULFATE (5 MG/ML) 0.5% IN NEBU
2.5000 mg | INHALATION_SOLUTION | Freq: Four times a day (QID) | RESPIRATORY_TRACT | Status: DC
  Administered 2012-06-03 (×2): 2.5 mg via RESPIRATORY_TRACT
  Filled 2012-06-03 (×2): qty 0.5

## 2012-06-03 MED ORDER — IPRATROPIUM BROMIDE 0.02 % IN SOLN
0.5000 mg | Freq: Four times a day (QID) | RESPIRATORY_TRACT | Status: DC
  Administered 2012-06-03 (×2): 0.5 mg via RESPIRATORY_TRACT
  Filled 2012-06-03 (×2): qty 2.5

## 2012-06-04 NOTE — Discharge Summary (Signed)
Death Summary  MERCED HANNERS ZOX:[id number] DOB: 1927-11-15 DOA: 2012-05-25  PCP: Sanjuana Letters, MD  Admit date: May 25, 2012 Date of Death: 06/06/2012  Final Diagnoses:   Principal Problem:  *Septic shock with Acute respiratory failure secondary to healthcare associated versus aspiration pneumonia  Active Problems:   LEUKEMIA, LYMPHOCYTIC, CHRONIC   DYSLIPIDEMIA   HYPERTENSION   DVT   TRANSIENT ISCHEMIC ATTACKS, HX OF   PULMONARY EMBOLISM, HX OF   Acute encephalopathy   Hypokalemia   Thrombocytopenia   Macrocytic anemia   Protein calorie malnutrition   History of atrial fibrillation   Hypernatremia  History of present illness:  Mr. Housey is an 78 year old male with history of CLL, dementia who was initially admitted to ICU May 25, 2012 for acute hypoxic respiratory failure requiring intubation and mechanical ventilation secondary to septic shock.    Hospital Course:  Mr. Nanda was initially admitted to the critical care team and was treated with broad spectrum antibiotics, aggressive volume resuscitation efforts and pressors. He was mechanically ventilated from 05/25/12-05/28/12.   Eventually weaned off pressors, extubated and then made DNR/DNI. Patient transferred to [facility] service 05/30/2012. Dr.Devine held a goals of care meeting with the patient and the patient's daughter, [name], and explained to him and his daughter that his medical condition was unlikely to improve with his current regimen, including but not limited to antibiotics. As of 05/31/12, the patient's daughter requested that we transition to comfort care.  He completed an 8 day course of antibiotics.  His feeding tube was removed and he was made comfortable.  A palliative care meeting was held with the patient's daughter and his 2 sons, who were all in agreement with comfort care and the plan to discharge him to a residential hospice facility.  He expired at approximately 2050 on June 04, 2012.   Time: 20  minutes.  Signed:  Denisia Harpole  [facility] 06/02/2012, 7:08 AM

## 2012-06-20 NOTE — Progress Notes (Signed)
CSW spoke with daughter Selena Batten concerning Pt transferring on 9/15 to [facility].   Daughter is agreeing to transfer and requested transfer be delayed until noon due to family conflicting schedules. (Daughter would like to be at the facility at the time Pt arrives.)   CSW will contact facility in the a.m to arrange time of transfer and make family aware of the definite time.   CSW to follow for d/c planning.   Leron Croak, LCSWA [employer] [phone]

## 2012-06-20 NOTE — Progress Notes (Signed)
[facility] PROGRESS NOTE  CAILAN GENERAL ZOX:[id number] DOB: 18-Nov-1927 DOA: 2012/05/28 PCP: Sanjuana Letters, MD  Brief narrative: Mr. Burchill is an 78 year old male with history of CLL, dementia who was initially admitted to ICU 05/28/2012 for acute hypoxic respiratory failure requiring intubation and mechanical ventilation and septic shock. Patient was treated with broad spectrum antibiotics, aggressive volume resuscitation efforts and pressors. Eventually weaned off pressors, extubated and then made DNR/DNI. Patient transferred to [facility] service 05/30/2012.  Dr.Devine held a goals of care meeting with the patient and the patient's daughter, [name], and explained to him and his daughter that his medical condition is unlikely to improve with his current regimen, including but not limited to antibiotics. As of 05/31/12, the patient's daughter has requested that we transition to comfort care.   Assessment/Plan: Principal Problem:  *Acute respiratory failure secondary to healthcare associated versus aspiration pneumonia / septic shock  Admitted to the ICU under the care of critical care team and maintained on mechanical ventilation 05/28/2012-05/28/12.  Placed on broad-spectrum antibiotics including vancomycin and Zosyn for an 8 day course.  Pressor support and volume resuscitation provided until hemodynamically stable.  Transferred to the care of the hospitalist service on 05/30/2012.  Goals of care discussed with the patient and his family by Dr. Elisabeth Pigeon, revisited these issues with the patient's daughter on 05/31/2012 and she is now requesting comfort measures.  Family meeting scheduled 06/02/2012 at 9:00 a.m.  Family all on board for full comfort care, and residential hospice placement. Active Problems:  Thrombocytopenia / macrocytic anemia  Likely reflective of CLL.  Protein calorie malnutrition  Tube feeding discontinued 05/31/12. Full comfort care.  Hypernatremia  Hypervolemic with  significant anasarca. Discontinue tube feeds, free water boluses, and further lab work on 05/31/12.  History of atrial fibrillation  Rate controlled, now off Coumadin.  LEUKEMIA, LYMPHOCYTIC, CHRONIC  Chronic elevation of white blood cells noted. Under the care of Dr. Welton Flakes.  No prior treatment.  DYSLIPIDEMIA  On statin as an outpatient, currently on hold.  History of HYPERTENSION  Hypotensive on admission. Blood pressure medications discontinued 9/11/3.  BP not appreciably elevated. Can use a clonidine patch if needed.  DVT /  PULMONARY EMBOLISM, HX OF  Has been on Coumadin in the past, this is currently discontinued.  TRANSIENT ISCHEMIC ATTACKS, HX OF  Has been on aspirin, but is currently on hold.  Acute encephalopathy  Likely from toxic causes secondary to sepsis.  Hypokalemia  Given 40 mEq of potassium elixir prior to the discontinuation of his nasogastric tube 05/31/12.  Code Status: DNR Family Communication: Lengthy discussion with daughter [name] [phone]) held at bedside 05/31/12. Updated by phone 06/01/12, 06/02/12.   Family meeting held with 2 sons and daughter 06/14/2012. Disposition Plan: SNF versus residential hospice.   Medical Consultants:  Dr. Koren Bound, PCCM  Other Consultants:  Dietician  Procedures: ETT: 9/4 >>>9/06  Lines: R IJ CVL 9/4 >>>  Antibiotics:  Zosyn 2012/05/28---> 05/31/2012  Vancomycin 2012/05/28---> 05/31/2012  HPI/Subjective: Mr. Heidelberger is less restless today.  Condition continues to deteriorate.  Not verbal. Objective: Filed Vitals:   06/01/12 1430 06/02/12 0625 05/26/2012 0524 05/22/2012 1106  BP: 123/54 162/79 138/64   Pulse: 60 53 70   Temp: 97.6 F (36.4 C) 97.7 F (36.5 C) 97.3 F (36.3 C)   TempSrc: Axillary Axillary Axillary   Resp: 17 18 20    Height:      Weight:      SpO2: 90% 92% 90% 90%  Intake/Output Summary (Last 24 hours) at Jul 01, 2012 1140 Last data filed at July 01, 2012 0526  Gross per 24 hour  Intake       0 ml  Output    500 ml  Net   -500 ml    Exam: Gen:  Less restless Cardiovascular:  RRR, No M/R/G Respiratory: Lungs  with audible rhonchi  Gastrointestinal: Abdomen soft, NT/ND with normal active bowel sounds. Extremities: Diffuse anasarca in the upper and lower extremities  Data Reviewed: Basic Metabolic Panel:  Lab 05/31/12 1610 05/29/12 0549 05/28/12 0529  NA 153* 154* 152*  K 2.9* 3.0* --  CL 120* 121* 121*  CO2 29 27 27   GLUCOSE 162* 114* 99  BUN 27* 28* 30*  CREATININE 0.84 0.92 0.90  CALCIUM 8.2* 8.0* 8.3*  MG -- -- --  PHOS -- -- --   GFR Estimated Creatinine Clearance: 78.8 ml/min (by C-G formula based on Cr of 0.84).  Coagulation profile  Lab 05/31/12 0500 05/30/12 0545 05/29/12 0549 05/28/12 0529  INR 1.58* 1.55* 1.80* 2.00*  PROTIME -- -- -- --    CBC:  Lab 05/31/12 0500 05/28/12 0529  WBC 33.6* 26.1*  NEUTROABS -- --  HGB 7.7* 9.3*  HCT 23.6* 27.9*  MCV 105.4* 102.6*  PLT 256 128*   BNP (last 3 results)  Basename 06/06/2012 0200  PROBNP 1248.0*   CBG:  Lab 05/31/12 1608 05/31/12 1427 05/31/12 0842 05/31/12 0400 05/31/12 0105  GLUCAP 115* 96 136* 128* 162*   Microbiology No results found for this or any previous visit (from the past 240 hour(s)).   Studies:  Dg Chest Port 1 View 05/29/2012  IMPRESSION: Worsening bilateral pneumonia.   Original Report Authenticated By: Reola Calkins, M.D.     Dg Chest Port 1 View 05/27/2012 IMPRESSION:  1.  Status post extubation with lower lung volumes and increase in perihilar and basilar atelectasis. 2.  Probable small effusions.   Original Report Authenticated By: P. Loralie Champagne, M.D.     Dg Chest Port 1 View 05/26/2012 IMPRESSION: Stable bibasilar opacities left greater than right.   Original Report Authenticated By: Donavan Burnet, M.D.     Dg Chest Port 1 View 05/25/2012 IMPRESSION:  1.  Support apparatus in good position as described above. 2.  Stable pulmonary aeration with bilateral basilar  opacities, left greater than right, likely a combination of airspace disease and atelectasis.   Original Report Authenticated By: Andreas Newport, M.D.     Dg Chest Portable 1 View 05/25/2012 IMPRESSION: Bilateral pulmonary infiltrates question edema versus infection.   Original Report Authenticated By: Lollie Marrow, M.D.     Dg Chest Port 1 View 06/17/2012 IMPRESSION: Line and tube positions as above. Perihilar infiltrates question edema versus infection.   Original Report Authenticated By: Lollie Marrow, M.D.     Scheduled Meds:    . albuterol  2.5 mg Nebulization Q6H  . antiseptic oral rinse  1 application Mouth Rinse QID  . chlorhexidine  15 mL Mouth/Throat BID  . ipratropium  0.5 mg Nebulization Q6H  . LORazepam  1 mg Sublingual Q4H  . scopolamine  1 patch Transdermal Q72H   Continuous Infusions:    . morphine 1 mg/hr (Jul 01, 2012 1042)    Time spent: 25 minutes   LOS: 10 days   RAMA,CHRISTINA  [facility] Pager [phone].  If 8PM-8AM, please contact night-coverage at [email or web address], password [facility] 2012-07-01, 11:40 AM

## 2012-06-20 NOTE — Significant Event (Signed)
Pt found at 2050 without apical or carotid pulse, no spontaneous respirations, pupils 4mm nonreactive.  This nurse and NT had recently been in room and repositioned pt.  Death pronounced by myself and Oneita Jolly RN.  Son, Christen Bame and daughter, [name] [facility]) notified of pts death.  Paged NP on call for hospitalist team to notify.  [facility] notified.  [facility] notified that pt will not be transferring tomorrow.

## 2012-06-20 NOTE — Consult Note (Signed)
Met with patients daughter and two sons for GOC meeting.   Summary of Meeting: 1. DNR 2. Discontinue all non-essential medications, no antibiotics, no artifical feeding. Allow for natural death to occur. 3. Aggressive Symptom management  Morphine SQ infusion, start at 1mg /hr, bolus prn  Scheduled Ativan  Scheduled nebs  Scop patch and atropine for upper airway secretions  4. Disposition: Patients prognosis is hours-days, appropriate for residential HOSPICE FACILITY, family request [facility]. CSW order placed.   Full consult note to follow.

## 2012-06-20 DEATH — deceased

## 2012-11-25 IMAGING — CR DG CHEST 1V PORT
1 series · 1 of 1 positions shown · non-contrast
Comparison: 05/26/2012.

CLINICAL DATA: Follow up pneumonia.

PORTABLE CHEST - 1 VIEW

[AP]
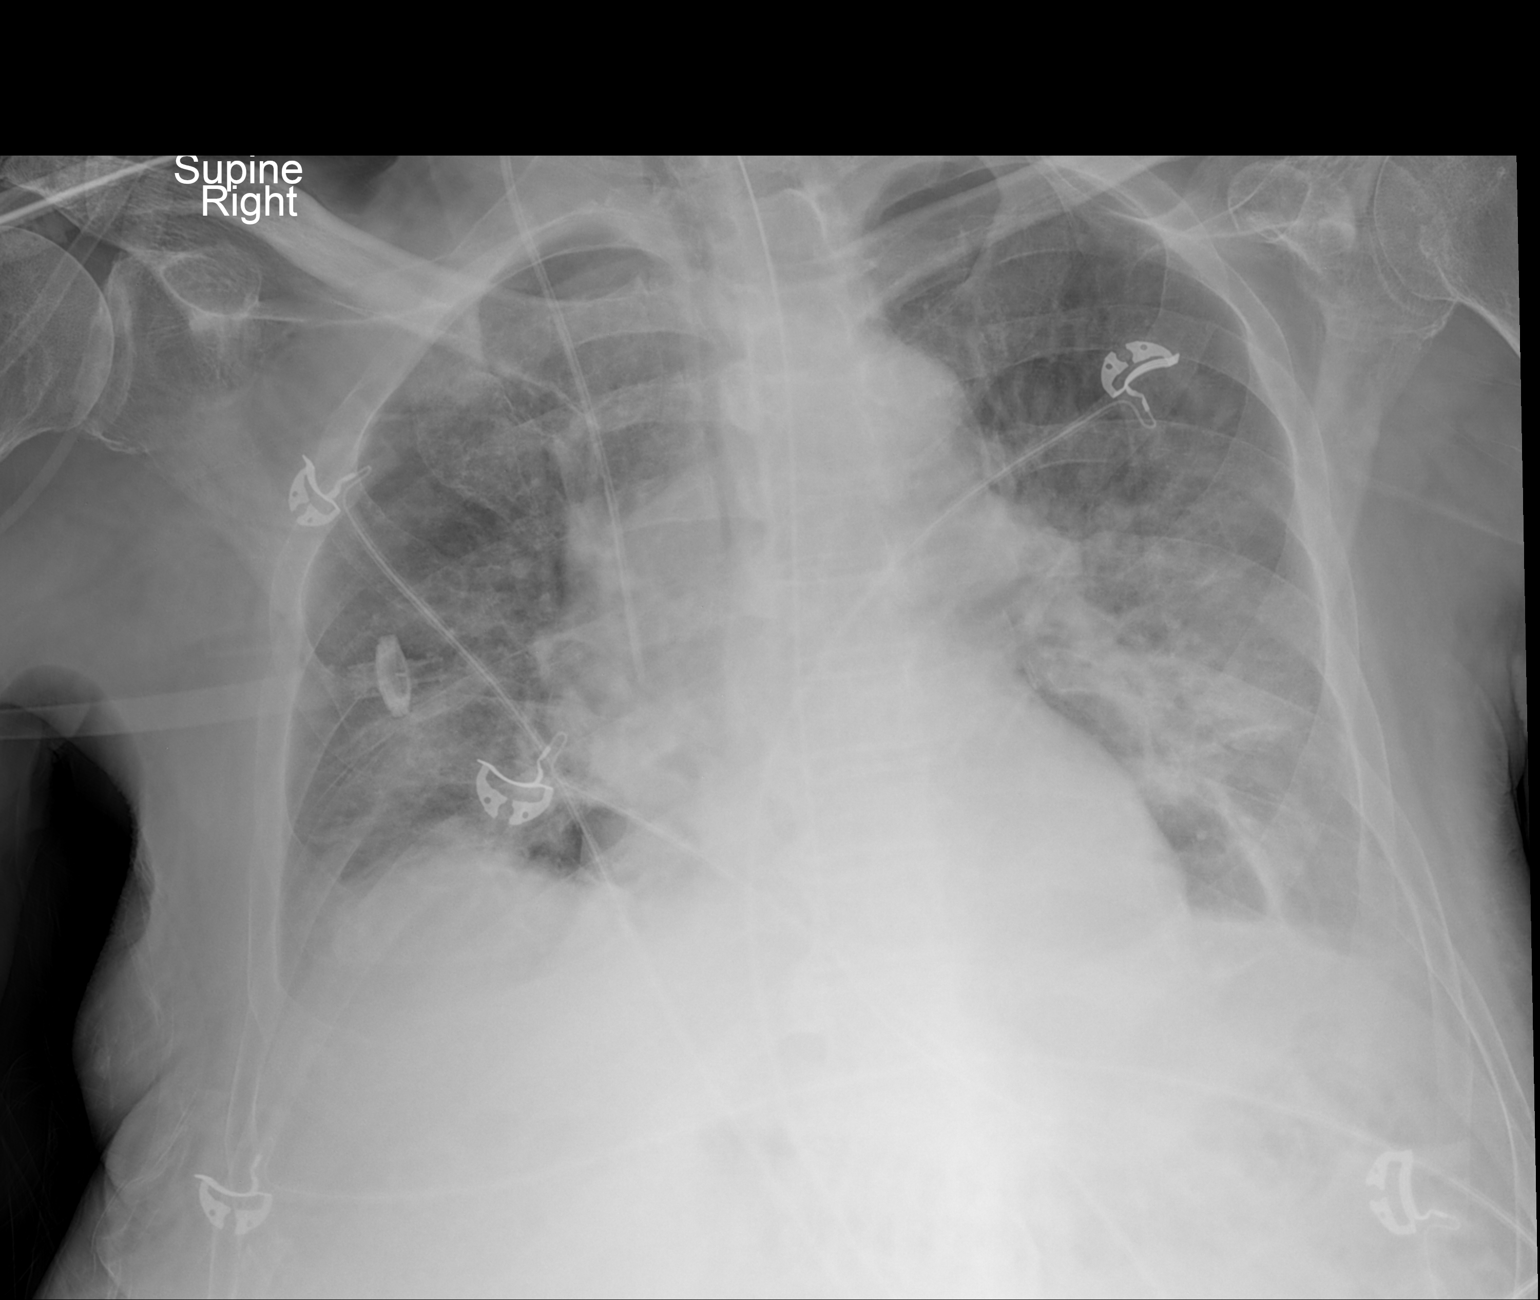

[1 of 1 positions shown; findings below may reference images not displayed]

FINDINGS: The endotracheal tube has been removed.  The NG tube and
right IJ catheters are stable.  Decreased lung volumes post
extubation with increase and perihilar and basilar atelectasis.
Suspect small effusions.  No pneumothorax.
IMPRESSION: 1.  Status post extubation with lower lung volumes and increase in
perihilar and basilar atelectasis.
2.  Probable small effusions.

## 2015-01-12 NOTE — Consult Note (Signed)
Brief Consult Note: Diagnosis: dizziness, weakness, falls, s/p fall, L hip fx, HTN, CLL, coagulopathy, aquired, dementia, Alzheimers type.   Patient was seen by consultant.   Consult note dictated.   Recommend to proceed with surgery or procedure.   Orders entered.   Comments: 1.preoperative evaluation for fall with subsequent L hip fx, suspend coumadin, pt has minor to intermediate clinical predictors to perioperative cardiovascular event, recommend to procede to OR, attempted to reach family to discuss surgical risks , tried few telephones on the list, unsucessfully, tried POA, Pt's daughter's Kim cell phone, but was not able to talk to her yet.  2. coagulopathy, aquired, holding coumadin, following INR 3. Alzheimers dementia, supporive therapy , haldol prn 4. renal insufficiency, IVF, following 5 CLL, not on any therapy at this time 6 thrombocytopenia, mild, following, get N23, folic ac levels  Thanks for consult, we''ll follow.  Electronic Signatures: Theodoro Grist (MD)  (Signed 01-Jul-13 22:08)  Authored: Brief Consult Note   Last Updated: 01-Jul-13 22:08 by Theodoro Grist (MD)

## 2015-01-12 NOTE — Consult Note (Signed)
PATIENT NAME:  AUTHOR, HATLESTAD MR#:  [id number] DATE OF BIRTH:  [birth year]  DATE OF CONSULTATION:  03/20/2012  REFERRING PHYSICIAN:   CONSULTING PHYSICIAN:  Theodoro Grist, MD  PRIMARY CARE PHYSICIAN: Dr. Arline Asp   HISTORY OF PRESENT ILLNESS: Patient is an 81 year old Caucasian male with past medical history significant for history of dizziness, weakness, falls, questionable vertebrobasilar insufficiency, history of back pain radiation to right leg in the past, history of cerebrovascular accident, hypertension, orthostatic hypotension, also history of CLL presented to the hospital after fall earlier today. Unfortunately patient himself is not able to provide any history, but according to medical records he was found on the floor at home. He was complaining of left hip pains. Here in the hospital patient is unable to provide much more history. He is babbling about his discomfort, but not able to explain to me his concerns.   PAST MEDICAL HISTORY:  1. History of prior lacunar infarct/CVA in 2001.  2. History of hypertension now off medications. 3. Chronic recurrent dizziness multifactorial due to prior cerebrovascular accident, age, probably vertebrobasilar insufficiency. 4. Arthritis of C-spine. 5. Significant orthostatic hypotension due to weight loss, as well as antihypertensive medications with  recurrent falls in 2013, now off medications.  6. History of CLL, which was considered to be stable. 7. Significant upper GI bleed due to possible duodenal ulcer healing per EGD.  8. Small intestinal bleeding requiring multiple transfusions of blood in 2000. 9. Pulmonary embolus in April 4401 as complication of hospitalization for upper GI bleed and increased hypercoagulable state from CLL on lifelong anticoagulation with warfarin. Recent discussion with Dr. Arline Asp about warfarin use and warfarin dose was cut down to minimal.  10. Dementia, Alzheimer type.   PAST SURGICAL HISTORY:  1. Appendectomy in  49s.  2. Inguinal hernia in 1960s.    MEDICATIONS:  1. Super B complex tablet, iron/vitamin B complex 27/300 mg 1 tablet once daily.  2. Tamsulosin 0.4 mg p.o. daily. 3. Meclizine 25 mg t.i.d.  4. Coumadin 2 mg p.o. daily.  5. Aspirin 81 mg p.o. daily.  6. Pantoprazole 40 mg p.o. daily.   ALLERGIES: No known drug allergies.   FAMILY HISTORY: No Alzheimer's or cerebrovascular accident.   SOCIAL HISTORY: Patient is married, has three children. 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 He has high school diploma. No alcohol, drug abuse or tobacco abuse.   REVIEW OF SYSTEMS: Difficult to obtain but according to medical records patient has recent 25 pound weight loss in the past one year, significant dizziness, weakness, increasing falls, also back pains, radiation to the right leg and progression of Alzheimer's disease. Patient himself is not able to provide any review of systems.   NOTE: Discussion with family about warfarin use, decision was made to decrease dose to minimal despite this being not optimal, not evidence based care and of high  risk of recurrent pulmonary embolus but patient's family was comfortable with that.   PHYSICAL EXAMINATION:  VITAL SIGNS: On arrival to the Emergency Room patient's vitals: Temperature 97.9, pulse 85, respiration rate 24, blood pressure 148/78, saturation 97% on room air.   GENERAL: This is a well-developed, well-nourished Caucasian male in no significant distress, babbling, somewhat uncomfortable, restless lying on the stretcher.   HEENT: His pupils are equal, reactive to light. Extraocular movements are intact. No icterus or conjunctivitis. Has normal hearing. No pharyngeal erythema. Mucosa is dry.   NECK: Neck did not reveal any masses, supple,  nontender. Thyroid is not enlarged. No adenopathy. No JVD or carotid bruits bilaterally. Full range of motion.   LUNGS: Clear to auscultation in all fields. No rales, rhonchi,  diminished breath sounds or wheezing. No labored inspiration, increased effort, dullness to percussion. No overt respiratory distress.   CARDIOVASCULAR: S1, S2 appreciated. No murmurs, gallops, rubs noted. PMI not lateralized. Chest is nontender to palpation.   EXTREMITIES: 1+ pedal pulses. No lower extremity edema, calf tenderness, or cyanosis.   ABDOMEN: Soft, nontender. Bowel sounds present. No hepatosplenomegaly or masses were noted.   RECTAL: Deferred.   MUSCULOSKELETAL: Muscle strength not able to evaluate as patient is uncooperative. Patient does have left lower extremity placed on the pillow and seemed to be comfortable, somewhat irritated left lower extremity externally but no cyanosis, degenerative joint disease. Not able to assess for kyphosis. Gait is not tested.   SKIN: Skin did not reveal any rashes, lesions, erythema, nodularity, induration. It was warm and dry to palpation.   LYMPH: No adenopathy in cervical region.   NEUROLOGICAL: Cranial nerves grossly intact. Sensory is intact. No dysarthria, aphasia. Patient is alert, disoriented to time, person, and place, not cooperative. He is babbling and not able to articulate his concerns at this point.   LABORATORY, DIAGNOSTIC AND RADIOLOGICAL DATA: BMP showed glucose 116, creatinine 1.57, chloride 110, bicarbonate 20, estimated GFR for non-African American would be 40. Patient's liver enzymes showed total protein of 5.9, total  bilirubin was 1.5, otherwise liver enzymes were unremarkable. Cardiac enzymes, first set, negative. CBC: White blood cell count 34.6, hemoglobin 13.9, platelet count 135, MCV high at 103. Coagulation panel: Pro time 19.0. INR 1.6. Urinalysis: Yellow clear urine, negative for glucose or bilirubin, ketones trace, specific gravity 1.020, pH 5.0, 1+ blood, 30 mg/dL protein, positive for nitrites, negative for leukocyte esterase, 1 red blood cell, 1 white blood cell, trace bacteria, no epithelial cells, mucous was  present. CT scan of cervical spine without contrast showed multilevel spondylolysis without evidence of acute osseous abnormality. Other radiologic studies are not available. Per Dr. Christophe Louis patient does have left hip fracture.   ASSESSMENT AND PLAN:  1. Dizziness, weakness, fall status post left hip fracture. Will suspend patient's Coumadin. Patient does have minor to intermediate clinical predictors for preoperative cardiovascular event. Recommend to proceed to Operating Room. Will discuss with family as well.   2. Coagulopathy, acquired. Will be holding Coumadin and follow his INR. Tentative operation is in two days.  3. Alzheimer's dementia. Supportive therapy. Haldol as needed.  4. Renal insufficiency. Will continue IV fluids. Will follow up patient's creatinine. 5. CLL. Patient is not on any therapy at this time. Will follow patient's white blood cell count as well as potassium and creatinine.  6. Thrombocytopenia, mild. Will get R48 as well as folic acid levels. Will follow patient's platelet count.  7. Thanks for the consult. Will follow patient along while he is in the hospital.   TIME SPENT: 50 minutes.   ____________________________ Theodoro Grist, MD rv:cms D: 03/20/2012 21:49:54 ET T: 03/21/2012 10:20:05 ET JOB#: [id number]  cc: Theodoro Grist, MD, <Dictator> Vianne Bulls. Arline Asp, MD Theodoro Grist MD ELECTRONICALLY SIGNED 04/09/2012 18:38

## 2015-01-12 NOTE — Discharge Summary (Signed)
PATIENT NAME:  Ernest Kramer, Ernest Kramer MR#:  [id number] DATE OF BIRTH:  16-Jan-1928  DATE OF ADMISSION:  03/20/2012 DATE OF DISCHARGE:  03/28/2012  DISCHARGE DIAGNOSES:  1. Displaced left femoral neck fracture.  2. Acquired coagulopathy on Coumadin therapy.  3. Alzheimer's dementia.  4. Renal insufficiency.  5. Chronic lymphocytic leukemia.  6. Thrombocytopenia.   OPERATIONS/PROCEDURES PERFORMED: Moore prosthetic left femoral head replacement on 03/22/2012.   HISTORY AND PHYSICAL EXAMINATION: As written on admission.   LABORATORY DATA: As noted in the chart.   HOSPITAL COURSE: The patient was admitted and evaluated by [facility]. He has been on Coumadin therapy for previous venous thrombosis and pulmonary embolism. By 03/22/2012 by  withholding his Coumadin and aspirin, his INR drifted down to 1.4 which was an acceptable level for surgery. On that day he was taken to the Operating Room where a Moore prosthetic left femoral head replacement was performed. The patient tolerated the procedure quite well without any particular complication. Postoperatively, the patient did relatively well with respect to his vital signs, but his demented condition worsened. He refused to participate in physical therapy. He has continued to have drainage from his wound. He is discharged at this time with an IV at 50 mL an hour and this could be discontinued as the patient begins to take p.o. food better. His wound is showing bloody drainage daily and this should be changed daily and painted with Betadine and new dressing applied. His Coumadin was lowered on the day of discharge to 1 mg every other day as his INR had risen to 3.2. He is to have a ProTime weekly to monitor the Coumadin therapy. His staples may be removed in two weeks only if his wound has healed and there is no drainage. Physical therapy orders are as written. Total hip precaution should be maintained at all times. He is to be up into the chair twice a day and he  may be fully weight-   bearing during transfers and using the walker. He is to return to the office in three weeks to see Dr. Tamala Julian for x-ray and exam.   ____________________________ Lucas Mallow, MD ces:ap D: 03/28/2012 11:06:33 ET T: 03/28/2012 11:27:09 ET JOB#: [id number]  cc: Lucas Mallow, MD, <Dictator> Lucas Mallow MD ELECTRONICALLY SIGNED 03/30/2012 7:31

## 2015-01-12 NOTE — Consult Note (Signed)
History of Present Illness:   Reason for Consult CLL.    HPI   Patient is an 81 year old male recently admitted after a fall that resulted in a left broken hip.  Patient has a history of CLL, but is unclear what his baseline white blood cell count is.  Patient also has underlying dementia.  Currently, he is lethargic and not arousable therefore review of systems is unobtainable.  PFSH:   Additional Past Medical and Surgical History Past medical history: CVA, hypertension, arthritis, CLL, GI bleed, PE, dementia.  Past surgical history: Appendectomy, hernia repair.  Family history: Negative and noncontributory.  Social history: No report of alcohol or tobacco use.   Review of Systems:   Performance Status (ECOG) 4    Review of Systems   unobtainable secondary to patient's lethargy.  NURSING NOTES: **Vital Signs.:   05-Jul-13 13:09    Vital Signs Type: Routine    Temperature Temperature (F): 97.3    Celsius: 36.2    Temperature Source: Oral    Pulse Pulse: 80    Respirations Respirations: 22    Systolic BP Systolic BP: 144    Diastolic BP (mmHg) Diastolic BP (mmHg): 64    Mean BP: 91    Pulse Ox % Pulse Ox %: 92   Physical Exam:   Physical Exam General: Ill-appearing, no acute distress. Eyes: Pink conjunctiva, anicteric sclera. Lungs: Clear to auscultation bilaterally. Heart: Regular rate and rhythm. No rubs, murmurs, or gallops. Abdomen: Soft, normoactive bowel sounds. Musculoskeletal: No edema, cyanosis, or clubbing. Neuro: Lethargic, not arousable. Skin: No rashes or petechiae noted.    No Known Allergies:     multivitamin: 1 tab(s) orally once a day, Active, 0, None   pantoprazole 40 mg oral delayed release tablet: 1 tab(s) orally once a day, Active, 0, None   tamsulosin 0.4 mg oral capsule: 1 cap(s) orally once a day (in the evening), Active, 0, None   meclizine 25 mg oral tablet: 1 tab(s) orally 2 times a day at breakfast and at lunch, Active, 0,  None   aspirin 81 mg oral tablet: 1 tab(s) orally once a day, Active, 0, None   Tylenol Caplet Extra Strength 500 mg oral tablet: 1 tab(s) orally 2-3 times a day, Active, 0, None   warfarin 4 mg oral tablet: 0.5 tab(s) (2 mg) orally once a day (in the evening), Active, 0, None   donepezil 10 mg oral tablet: 1 tab(s) orally once a day, Active, 0, None  Laboratory Results: Routine Chem:  05-Jul-13 03:03    Glucose, Serum  123   BUN 17   Creatinine (comp) 1.15   Sodium, Serum 141   Potassium, Serum 3.9   Chloride, Serum  108   CO2, Serum 25   Calcium (Total), Serum  7.9   Anion Gap 8   Osmolality (calc) 284   eGFR (African American) >60   eGFR (Non-African American)  59 (eGFR values <77m/min/1.73 m2 may be an indication of chronic kidney disease (CKD). Calculated eGFR is useful in patients with stable renal function. The eGFR calculation will not be reliable in acutely ill patients when serum creatinine is changing rapidly. It is not useful in  patients on dialysis. The eGFR calculation may not be applicable to patients at the low and high extremes of body sizes, pregnant women, and vegetarians.)  Routine Coag:  05-Jul-13 03:03    Prothrombin  30.4   INR 2.9 (INR reference interval applies to patients on anticoagulant therapy. A single INR  therapeutic range for coumarins is not optimal for all indications; however, the suggested range for most indications is 2.0 - 3.0. Exceptions to the INR Reference Range may include: Prosthetic heart valves, acute myocardial infarction, prevention of myocardial infarction, and combinations of aspirin and anticoagulant. The need for a higher or lower target INR must be assessed individually. Reference: The Pharmacology and Management of the Vitamin K  antagonists: the seventh ACCP Conference on Antithrombotic and Thrombolytic Therapy. URKYH.0623 Sept:126 (3suppl): [id number]. A HCT value >55% may artifactually increase the PT.  In one  study,  the increase was an average of 25%. Reference:  "Effect on Routine and Special Coagulation Testing Values of Citrate Anticoagulant Adjustment in Patients with High HCT Values." American Journal of Clinical Pathology 2006;126:400-405.)  Routine Hem:  05-Jul-13 03:03    WBC (CBC)  46.9   RBC (CBC)  2.93   Hemoglobin (CBC)  9.9   Hematocrit (CBC)  29.9   Platelet Count (CBC)  139 (Result(s) reported on 24 Mar 2012 at 04:47AM.)   MCV  102   MCH 33.9   MCHC 33.2   RDW 13.2   Segmented Neutrophils 24   Lymphocytes 70   Variant Lymphocytes 5   Monocytes 1   Diff Comment 1 RBCs APPEAR NORMAL   Diff Comment 2 PLTS VARIED IN SIZE  Result(s) reported on 24 Mar 2012 at 04:47AM.   Assessment and Plan:  Impression:   CLL now with fractured left hip.  Plan:   1.  CLL: No intervention is needed.  Patient's thrombocytopenia and anemia is likely secondary to his CLL. Patient's primary oncologist is in [place].  Based on the information we have, he is a stage 0 and no treatment is necessary.  Even if patient would require treatment, he likely would not be able to tolerate it given his multiple comorbidities of advanced age.  Please ensure patient keeps his followup appointments with his primary oncologist upon discharge. Anemia/thrombocytopenia: No intervention is needed at this time.  Patient does not require any transfusions. consult, call questions.  Electronic Signatures: Delight Hoh (MD)  (Signed 05-Jul-13 16:53)  Authored: HISTORY OF PRESENT ILLNESS, PFSH, ROS, NURSING NOTES, PE, ALLERGIES, HOME MEDICATIONS, LABS, ASSESSMENT AND PLAN   Last Updated: 05-Jul-13 16:53 by Delight Hoh (MD)

## 2015-01-12 NOTE — Op Note (Signed)
PATIENT NAME:  Ernest Kramer, Ernest Kramer MR#:  [id number] DATE OF BIRTH:  1927/12/08  DATE OF PROCEDURE:  03/22/2012  PREOPERATIVE DIAGNOSIS: Displaced left femoral neck fracture.   POSTOPERATIVE DIAGNOSIS: Displaced left femoral neck fracture.   PROCEDURE: Moore prosthetic left femoral head replacement.   SURGEON: Christophe Louis, MD   ANESTHESIA: General.   COMPLICATIONS: None.   ESTIMATED BLOOD LOSS: 450 mL.   DRAINS: None.   DESCRIPTION OF PROCEDURE: After adequate induction of general anesthesia, the patient is placed in the right lateral decubitus position and secured with the bean bag in the usual manner for left hip surgery. One gram of Ancef is given intravenously. The hip is thoroughly prepped with alcohol and ChloraPrep and draped in standard sterile fashion. A standard posterolateral incision is made and the dissection carried down to the fascia lata, which is incised in the line of the skin incision. The sciatic nerve is palpated deep within the wound and is protected throughout the case. Trochanteric bursa is excised. A Charnley retractor is placed. The piriformis tendon is identified, cut, and tagged. The remainder of the external rotator muscles are cut with the Bovie, and the capsule is completely revealed with the periosteal elevator. A T-type incision is made in the capsule, and the ends are tagged, and the capsule is preserved. The femoral neck is cut off at the appropriate angle and length and the femoral head is removed. The acetabulum is thoroughly irrigated multiple times. The femoral head is sized as a 57. The proximal femur is reamed in the usual manner with the broach for the standard AT Moore prosthesis. The wound is thoroughly irrigated multiple times again with pulsatile lavage. The 55 mm standard AT Laurance Flatten prosthesis is then impacted into place and is seen to be a good solid fit. The hip is reduced and is seen to be stable in all directions. The posterior capsule is  repaired through the abductor mechanism, as is the piriformis tendon. Hemostasis is maintained at all times with the Bovie. The fascia lata is closed with 0 Tycron. Subcutaneous tissue is closed with 3-0 and 2-0 Vicryl, and the skin is closed with the skin stapler. A soft bulky dressing is applied. The patient is returned to the hospital bed, and placed in the abduction pillow, and returned to the recovery room in satisfactory condition having tolerated the procedure quite well.  ____________________________ Lucas Mallow, MD ces:cbb D: 03/22/2012 12:24:55 ET T: 03/22/2012 13:07:40 ET JOB#: [id number]  cc: Lucas Mallow, MD, <Dictator> Lucas Mallow MD ELECTRONICALLY SIGNED 03/23/2012 8:35
